# Patient Record
Sex: Female | Born: 1945
Health system: Southern US, Community
[De-identification: ages and names within clinical notes are randomized; demographics above are authoritative.]

## PROBLEM LIST (undated history)

## (undated) DIAGNOSIS — M7660 Achilles tendinitis, unspecified leg: Secondary | ICD-10-CM

## (undated) DIAGNOSIS — M8580 Other specified disorders of bone density and structure, unspecified site: Secondary | ICD-10-CM

## (undated) DIAGNOSIS — N946 Dysmenorrhea, unspecified: Secondary | ICD-10-CM

## (undated) DIAGNOSIS — N941 Unspecified dyspareunia: Secondary | ICD-10-CM

## (undated) DIAGNOSIS — K5790 Diverticulosis of intestine, part unspecified, without perforation or abscess without bleeding: Secondary | ICD-10-CM

## (undated) DIAGNOSIS — K219 Gastro-esophageal reflux disease without esophagitis: Secondary | ICD-10-CM

## (undated) DIAGNOSIS — N8110 Cystocele, unspecified: Secondary | ICD-10-CM

## (undated) DIAGNOSIS — E7800 Pure hypercholesterolemia, unspecified: Secondary | ICD-10-CM

## (undated) DIAGNOSIS — C439 Malignant melanoma of skin, unspecified: Secondary | ICD-10-CM

## (undated) DIAGNOSIS — Z8742 Personal history of other diseases of the female genital tract: Secondary | ICD-10-CM

## (undated) DIAGNOSIS — N816 Rectocele: Secondary | ICD-10-CM

## (undated) DIAGNOSIS — J309 Allergic rhinitis, unspecified: Secondary | ICD-10-CM

## (undated) DIAGNOSIS — M1990 Unspecified osteoarthritis, unspecified site: Secondary | ICD-10-CM

## (undated) DIAGNOSIS — E785 Hyperlipidemia, unspecified: Secondary | ICD-10-CM

## (undated) DIAGNOSIS — E559 Vitamin D deficiency, unspecified: Secondary | ICD-10-CM

## (undated) DIAGNOSIS — N814 Uterovaginal prolapse, unspecified: Secondary | ICD-10-CM

## (undated) DIAGNOSIS — D0359 Melanoma in situ of other part of trunk: Secondary | ICD-10-CM

## (undated) DIAGNOSIS — R7302 Impaired glucose tolerance (oral): Secondary | ICD-10-CM

## (undated) DIAGNOSIS — L509 Urticaria, unspecified: Secondary | ICD-10-CM

## (undated) HISTORY — PX: SKIN CANCER EXCISION: SHX779

## (undated) HISTORY — DX: Unspecified dyspareunia: N94.10

## (undated) HISTORY — DX: Dysmenorrhea, unspecified: N94.6

## (undated) HISTORY — DX: Unspecified osteoarthritis, unspecified site: M19.90

## (undated) HISTORY — DX: Cystocele, unspecified: N81.10

## (undated) HISTORY — DX: Rectocele: N81.6

## (undated) HISTORY — PX: APPENDECTOMY: SHX54

## (undated) HISTORY — DX: Diverticulosis of intestine, part unspecified, without perforation or abscess without bleeding: K57.90

## (undated) HISTORY — PX: COLONOSCOPY: SHX174

## (undated) HISTORY — DX: Personal history of other diseases of the female genital tract: Z87.42

## (undated) HISTORY — PX: OTHER SURGICAL HISTORY: SHX169

## (undated) HISTORY — DX: Malignant melanoma of skin, unspecified: C43.9

## (undated) HISTORY — DX: Achilles tendinitis, unspecified leg: M76.60

## (undated) HISTORY — PX: SEPTOPLASTY: SUR1290

## (undated) HISTORY — DX: Pure hypercholesterolemia, unspecified: E78.00

---

## 1989-10-05 HISTORY — PX: BREAST EXCISIONAL BIOPSY: SUR124

## 1989-10-05 HISTORY — PX: BREAST BIOPSY: SHX20

## 2005-10-15 ENCOUNTER — Ambulatory Visit: Payer: Self-pay | Admitting: Internal Medicine

## 2006-11-08 ENCOUNTER — Ambulatory Visit: Payer: Self-pay | Admitting: Internal Medicine

## 2007-06-01 ENCOUNTER — Ambulatory Visit: Payer: Self-pay | Admitting: Otolaryngology

## 2007-12-26 ENCOUNTER — Ambulatory Visit: Payer: Self-pay | Admitting: Internal Medicine

## 2009-07-04 ENCOUNTER — Ambulatory Visit: Payer: Self-pay | Admitting: Unknown Physician Specialty

## 2009-07-10 ENCOUNTER — Ambulatory Visit: Payer: Self-pay | Admitting: Gastroenterology

## 2010-06-17 ENCOUNTER — Emergency Department: Payer: Self-pay | Admitting: Emergency Medicine

## 2010-07-02 ENCOUNTER — Ambulatory Visit: Payer: Self-pay | Admitting: Family Medicine

## 2010-07-17 ENCOUNTER — Ambulatory Visit: Payer: Self-pay | Admitting: Unknown Physician Specialty

## 2011-10-13 ENCOUNTER — Ambulatory Visit: Payer: Self-pay | Admitting: Internal Medicine

## 2011-10-14 ENCOUNTER — Ambulatory Visit: Payer: Self-pay | Admitting: Internal Medicine

## 2012-04-13 ENCOUNTER — Ambulatory Visit: Payer: Self-pay | Admitting: Internal Medicine

## 2012-10-13 ENCOUNTER — Ambulatory Visit: Payer: Self-pay | Admitting: Internal Medicine

## 2012-10-24 ENCOUNTER — Ambulatory Visit: Payer: Self-pay | Admitting: Internal Medicine

## 2013-10-16 ENCOUNTER — Ambulatory Visit: Payer: Self-pay | Admitting: Internal Medicine

## 2014-08-14 ENCOUNTER — Ambulatory Visit: Payer: Self-pay | Admitting: Gastroenterology

## 2014-08-14 DIAGNOSIS — Z86010 Personal history of colonic polyps: Secondary | ICD-10-CM | POA: Insufficient documentation

## 2014-08-14 DIAGNOSIS — Z860101 Personal history of adenomatous and serrated colon polyps: Secondary | ICD-10-CM | POA: Insufficient documentation

## 2014-09-13 DIAGNOSIS — E559 Vitamin D deficiency, unspecified: Secondary | ICD-10-CM | POA: Insufficient documentation

## 2014-09-13 DIAGNOSIS — E785 Hyperlipidemia, unspecified: Secondary | ICD-10-CM | POA: Insufficient documentation

## 2014-09-13 DIAGNOSIS — N952 Postmenopausal atrophic vaginitis: Secondary | ICD-10-CM | POA: Insufficient documentation

## 2014-09-13 DIAGNOSIS — J309 Allergic rhinitis, unspecified: Secondary | ICD-10-CM | POA: Insufficient documentation

## 2014-09-13 DIAGNOSIS — R7302 Impaired glucose tolerance (oral): Secondary | ICD-10-CM | POA: Insufficient documentation

## 2014-09-13 DIAGNOSIS — K219 Gastro-esophageal reflux disease without esophagitis: Secondary | ICD-10-CM | POA: Insufficient documentation

## 2014-09-20 DIAGNOSIS — Z87898 Personal history of other specified conditions: Secondary | ICD-10-CM | POA: Insufficient documentation

## 2014-10-31 ENCOUNTER — Ambulatory Visit: Payer: Self-pay | Admitting: Internal Medicine

## 2014-10-31 DIAGNOSIS — Z1231 Encounter for screening mammogram for malignant neoplasm of breast: Secondary | ICD-10-CM | POA: Diagnosis not present

## 2015-01-28 LAB — SURGICAL PATHOLOGY

## 2015-02-05 DIAGNOSIS — H40003 Preglaucoma, unspecified, bilateral: Secondary | ICD-10-CM | POA: Diagnosis not present

## 2015-02-21 DIAGNOSIS — N950 Postmenopausal bleeding: Secondary | ICD-10-CM | POA: Diagnosis not present

## 2015-02-21 DIAGNOSIS — N816 Rectocele: Secondary | ICD-10-CM | POA: Diagnosis not present

## 2015-02-21 DIAGNOSIS — N3289 Other specified disorders of bladder: Secondary | ICD-10-CM | POA: Diagnosis not present

## 2015-02-21 DIAGNOSIS — N949 Unspecified condition associated with female genital organs and menstrual cycle: Secondary | ICD-10-CM | POA: Diagnosis not present

## 2015-02-21 DIAGNOSIS — N812 Incomplete uterovaginal prolapse: Secondary | ICD-10-CM | POA: Diagnosis not present

## 2015-02-21 DIAGNOSIS — N762 Acute vulvitis: Secondary | ICD-10-CM | POA: Diagnosis not present

## 2015-02-21 DIAGNOSIS — N8110 Cystocele, unspecified: Secondary | ICD-10-CM | POA: Diagnosis not present

## 2015-03-08 ENCOUNTER — Other Ambulatory Visit: Payer: Self-pay

## 2015-03-08 ENCOUNTER — Other Ambulatory Visit: Payer: Commercial Managed Care - HMO

## 2015-03-08 DIAGNOSIS — N950 Postmenopausal bleeding: Secondary | ICD-10-CM

## 2015-03-12 ENCOUNTER — Telehealth: Payer: Self-pay | Admitting: Obstetrics and Gynecology

## 2015-03-12 NOTE — Telephone Encounter (Signed)
CALLED FOR [place] RESULTS. PLEASE CALL PT.

## 2015-03-12 NOTE — Telephone Encounter (Signed)
Pls review u/s report and I will contact pt. ty

## 2015-03-14 ENCOUNTER — Telehealth: Payer: Self-pay | Admitting: Obstetrics and Gynecology

## 2015-03-14 NOTE — Telephone Encounter (Signed)
Pt aware u/s results are normal. Lining is thin- no need for bx. Pt will monitor bleeding. May need endosee per mad if persists. Pt advised to continue with premarin.

## 2015-03-14 NOTE — Telephone Encounter (Signed)
Patient called stating that she is very anxious to get the results from her ultrasound last Friday. Patient is aware that Dr D has the Ultrasound to review.Thanks

## 2015-08-20 DIAGNOSIS — Z23 Encounter for immunization: Secondary | ICD-10-CM | POA: Diagnosis not present

## 2015-08-21 DIAGNOSIS — H40003 Preglaucoma, unspecified, bilateral: Secondary | ICD-10-CM | POA: Diagnosis not present

## 2015-08-23 DIAGNOSIS — Z0100 Encounter for examination of eyes and vision without abnormal findings: Secondary | ICD-10-CM | POA: Diagnosis not present

## 2015-10-24 ENCOUNTER — Other Ambulatory Visit: Payer: Self-pay | Admitting: Internal Medicine

## 2015-10-24 DIAGNOSIS — Z0000 Encounter for general adult medical examination without abnormal findings: Secondary | ICD-10-CM | POA: Diagnosis not present

## 2015-10-24 DIAGNOSIS — R7302 Impaired glucose tolerance (oral): Secondary | ICD-10-CM | POA: Diagnosis not present

## 2015-10-24 DIAGNOSIS — Z23 Encounter for immunization: Secondary | ICD-10-CM | POA: Diagnosis not present

## 2015-10-24 DIAGNOSIS — K219 Gastro-esophageal reflux disease without esophagitis: Secondary | ICD-10-CM | POA: Diagnosis not present

## 2015-10-24 DIAGNOSIS — E559 Vitamin D deficiency, unspecified: Secondary | ICD-10-CM | POA: Diagnosis not present

## 2015-10-24 DIAGNOSIS — N952 Postmenopausal atrophic vaginitis: Secondary | ICD-10-CM | POA: Diagnosis not present

## 2015-10-24 DIAGNOSIS — Z1239 Encounter for other screening for malignant neoplasm of breast: Secondary | ICD-10-CM | POA: Diagnosis not present

## 2015-10-24 DIAGNOSIS — J301 Allergic rhinitis due to pollen: Secondary | ICD-10-CM | POA: Diagnosis not present

## 2015-10-24 DIAGNOSIS — E782 Mixed hyperlipidemia: Secondary | ICD-10-CM | POA: Diagnosis not present

## 2015-10-24 DIAGNOSIS — Z1231 Encounter for screening mammogram for malignant neoplasm of breast: Secondary | ICD-10-CM

## 2015-11-05 ENCOUNTER — Ambulatory Visit
Admission: RE | Admit: 2015-11-05 | Discharge: 2015-11-05 | Disposition: A | Discharge status: Home / Self Care | Payer: Commercial Managed Care - HMO | Source: Ambulatory Visit | Attending: Internal Medicine | Admitting: Internal Medicine

## 2015-11-05 DIAGNOSIS — L821 Other seborrheic keratosis: Secondary | ICD-10-CM | POA: Diagnosis not present

## 2015-11-05 DIAGNOSIS — L570 Actinic keratosis: Secondary | ICD-10-CM | POA: Diagnosis not present

## 2015-11-05 DIAGNOSIS — Z1231 Encounter for screening mammogram for malignant neoplasm of breast: Secondary | ICD-10-CM | POA: Diagnosis not present

## 2015-11-05 DIAGNOSIS — D485 Neoplasm of uncertain behavior of skin: Secondary | ICD-10-CM | POA: Diagnosis not present

## 2015-11-05 DIAGNOSIS — Z1283 Encounter for screening for malignant neoplasm of skin: Secondary | ICD-10-CM | POA: Diagnosis not present

## 2015-11-05 DIAGNOSIS — D0359 Melanoma in situ of other part of trunk: Secondary | ICD-10-CM | POA: Diagnosis not present

## 2015-11-14 DIAGNOSIS — D0359 Melanoma in situ of other part of trunk: Secondary | ICD-10-CM | POA: Diagnosis not present

## 2015-11-14 DIAGNOSIS — D225 Melanocytic nevi of trunk: Secondary | ICD-10-CM | POA: Diagnosis not present

## 2015-11-29 DIAGNOSIS — K55062 Diffuse acute infarction of intestine, part unspecified: Secondary | ICD-10-CM | POA: Diagnosis not present

## 2015-12-23 ENCOUNTER — Other Ambulatory Visit
Admission: RE | Admit: 2015-12-23 | Discharge: 2015-12-23 | Disposition: A | Discharge status: Home / Self Care | Payer: Commercial Managed Care - HMO | Source: Ambulatory Visit | Attending: Unknown Physician Specialty | Admitting: Unknown Physician Specialty

## 2015-12-23 DIAGNOSIS — M25562 Pain in left knee: Secondary | ICD-10-CM | POA: Diagnosis not present

## 2015-12-23 LAB — SYNOVIAL FLUID, CRYSTAL: Crystals, Fluid: NONE SEEN

## 2016-01-01 DIAGNOSIS — R262 Difficulty in walking, not elsewhere classified: Secondary | ICD-10-CM | POA: Diagnosis not present

## 2016-01-01 DIAGNOSIS — M25562 Pain in left knee: Secondary | ICD-10-CM | POA: Diagnosis not present

## 2016-01-03 DIAGNOSIS — R262 Difficulty in walking, not elsewhere classified: Secondary | ICD-10-CM | POA: Diagnosis not present

## 2016-01-03 DIAGNOSIS — M25562 Pain in left knee: Secondary | ICD-10-CM | POA: Diagnosis not present

## 2016-01-07 DIAGNOSIS — R262 Difficulty in walking, not elsewhere classified: Secondary | ICD-10-CM | POA: Diagnosis not present

## 2016-01-07 DIAGNOSIS — M25562 Pain in left knee: Secondary | ICD-10-CM | POA: Diagnosis not present

## 2016-01-09 DIAGNOSIS — M25562 Pain in left knee: Secondary | ICD-10-CM | POA: Diagnosis not present

## 2016-01-09 DIAGNOSIS — R262 Difficulty in walking, not elsewhere classified: Secondary | ICD-10-CM | POA: Diagnosis not present

## 2016-02-04 DIAGNOSIS — M25562 Pain in left knee: Secondary | ICD-10-CM | POA: Diagnosis not present

## 2016-02-04 DIAGNOSIS — R262 Difficulty in walking, not elsewhere classified: Secondary | ICD-10-CM | POA: Diagnosis not present

## 2016-02-06 DIAGNOSIS — R262 Difficulty in walking, not elsewhere classified: Secondary | ICD-10-CM | POA: Diagnosis not present

## 2016-02-06 DIAGNOSIS — M25562 Pain in left knee: Secondary | ICD-10-CM | POA: Diagnosis not present

## 2016-02-11 DIAGNOSIS — Z1283 Encounter for screening for malignant neoplasm of skin: Secondary | ICD-10-CM | POA: Diagnosis not present

## 2016-02-11 DIAGNOSIS — Z08 Encounter for follow-up examination after completed treatment for malignant neoplasm: Secondary | ICD-10-CM | POA: Diagnosis not present

## 2016-02-11 DIAGNOSIS — L910 Hypertrophic scar: Secondary | ICD-10-CM | POA: Diagnosis not present

## 2016-02-11 DIAGNOSIS — R262 Difficulty in walking, not elsewhere classified: Secondary | ICD-10-CM | POA: Diagnosis not present

## 2016-02-11 DIAGNOSIS — Z85820 Personal history of malignant melanoma of skin: Secondary | ICD-10-CM | POA: Diagnosis not present

## 2016-02-11 DIAGNOSIS — M25562 Pain in left knee: Secondary | ICD-10-CM | POA: Diagnosis not present

## 2016-02-13 DIAGNOSIS — M25562 Pain in left knee: Secondary | ICD-10-CM | POA: Diagnosis not present

## 2016-02-13 DIAGNOSIS — R262 Difficulty in walking, not elsewhere classified: Secondary | ICD-10-CM | POA: Diagnosis not present

## 2016-02-18 DIAGNOSIS — R262 Difficulty in walking, not elsewhere classified: Secondary | ICD-10-CM | POA: Diagnosis not present

## 2016-02-18 DIAGNOSIS — M25562 Pain in left knee: Secondary | ICD-10-CM | POA: Diagnosis not present

## 2016-02-20 DIAGNOSIS — R262 Difficulty in walking, not elsewhere classified: Secondary | ICD-10-CM | POA: Diagnosis not present

## 2016-02-20 DIAGNOSIS — M25562 Pain in left knee: Secondary | ICD-10-CM | POA: Diagnosis not present

## 2016-02-25 ENCOUNTER — Encounter: Payer: Commercial Managed Care - HMO | Admitting: Obstetrics and Gynecology

## 2016-02-27 DIAGNOSIS — R262 Difficulty in walking, not elsewhere classified: Secondary | ICD-10-CM | POA: Diagnosis not present

## 2016-02-27 DIAGNOSIS — M25562 Pain in left knee: Secondary | ICD-10-CM | POA: Diagnosis not present

## 2016-03-09 DIAGNOSIS — M25562 Pain in left knee: Secondary | ICD-10-CM | POA: Diagnosis not present

## 2016-03-09 DIAGNOSIS — R262 Difficulty in walking, not elsewhere classified: Secondary | ICD-10-CM | POA: Diagnosis not present

## 2016-03-12 ENCOUNTER — Encounter: Payer: Commercial Managed Care - HMO | Admitting: Obstetrics and Gynecology

## 2016-03-19 DIAGNOSIS — M25562 Pain in left knee: Secondary | ICD-10-CM | POA: Diagnosis not present

## 2016-03-19 DIAGNOSIS — R262 Difficulty in walking, not elsewhere classified: Secondary | ICD-10-CM | POA: Diagnosis not present

## 2016-04-01 DIAGNOSIS — R262 Difficulty in walking, not elsewhere classified: Secondary | ICD-10-CM | POA: Diagnosis not present

## 2016-04-01 DIAGNOSIS — M25562 Pain in left knee: Secondary | ICD-10-CM | POA: Diagnosis not present

## 2016-06-25 ENCOUNTER — Encounter: Payer: Commercial Managed Care - HMO | Admitting: Obstetrics and Gynecology

## 2016-07-09 DIAGNOSIS — H40003 Preglaucoma, unspecified, bilateral: Secondary | ICD-10-CM | POA: Diagnosis not present

## 2016-07-15 ENCOUNTER — Encounter: Payer: Commercial Managed Care - HMO | Admitting: Obstetrics and Gynecology

## 2016-07-20 NOTE — Progress Notes (Signed)
ANNUAL PREVENTATIVE CARE GYN  ENCOUNTER NOTE  Subjective:       Tracy Rodgers is a 70 y.o. [phone] female here for a routine annual gynecologic exam.  Current complaints: 1.   None- only wants pelvic- pcp does breast exam 2. Cystocele 3. Vaginal atrophy  Currently using remnant cream intravaginal twice weekly;  Seems to be helping symptoms Voiding regularly; occasionally with incomplete emptying; may consider pessary in future     Gynecologic History No LMP recorded. Patient is postmenopausal. Contraception: post menopausal status Last Pap: 2010 neg. Results were: normal Last mammogram: 10/2015 birad 1. Results were: normal  Obstetric History OB History  Gravida Para Term Preterm AB Living  3 3 3     3   SAB TAB Ectopic Multiple Live Births          3    # Outcome Date GA Lbr Len/2nd Weight Sex Delivery Anes PTL Lv  3 Term 1964   7 lb (3.175 kg)  Vag-Spont   LIV  2 Term 1963   4 lb (1.814 kg)  Vag-Spont   LIV  1 Term 1962   6 lb (2.722 kg)  Vag-Spont   LIV      Past Medical History:  Diagnosis Date  . Dyspareunia, female   . Elevated cholesterol   . History of heavy periods   . Painful menstrual periods     Past Surgical History:  Procedure Laterality Date  . APPENDECTOMY    . BREAST BIOPSY Left 1991   neg  . broken nose      Current Outpatient Prescriptions on File Prior to Visit  Medication Sig Dispense Refill  . conjugated estrogens (PREMARIN) vaginal cream Place vaginally.     No current facility-administered medications on file prior to visit.     Allergies  Allergen Reactions  . Nsaids Rash  . Oxycodone-Acetaminophen Nausea Only and Nausea And Vomiting  . Aspirin   . Ibuprofen Hives    Social History   Social History  . Marital status: Married    Spouse name: N/A  . Number of children: N/A  . Years of education: N/A   Occupational History  . Not on file.   Social History Main Topics  . Smoking status: Never Smoker  . Smokeless  tobacco: Never Used  . Alcohol use Yes     Comment: occas  . Drug use: No  . Sexual activity: Yes    Birth control/ protection: Post-menopausal   Other Topics Concern  . Not on file   Social History Narrative  . No narrative on file    Family History  Problem Relation Age of Onset  . Colon cancer Father   . Colon cancer Sister   . Breast cancer Neg Hx   . Diabetes Neg Hx   . Ovarian cancer Neg Hx   . Heart disease Neg Hx     The following portions of the patient's history were reviewed and updated as appropriate: allergies, current medications, past family history, past medical history, past social history, past surgical history and problem list.  Review of Systems ROS Review of Systems - General ROS: negative for - chills, fatigue, fever, hot flashes, night sweats, weight gain or weight loss Psychological ROS: negative for - anxiety, decreased libido, depression, mood swings, physical abuse or sexual abuse Ophthalmic ROS: negative for - blurry vision, eye pain or loss of vision ENT ROS: negative for - headaches, hearing change, visual changes or vocal changes Allergy and Immunology ROS:  negative for - hives, itchy/watery eyes or seasonal allergies Hematological and Lymphatic ROS: negative for - bleeding problems, bruising, swollen lymph nodes or weight loss Endocrine ROS: negative for - galactorrhea, hair pattern changes, hot flashes, malaise/lethargy, mood swings, palpitations, polydipsia/polyuria, skin changes, temperature intolerance or unexpected weight changes Breast ROS: negative for - new or changing breast lumps or nipple discharge Respiratory ROS: negative for - cough or shortness of breath Cardiovascular ROS: negative for - chest pain, irregular heartbeat, palpitations or shortness of breath Gastrointestinal ROS: no abdominal pain, change in bowel habits, or black or bloody stools Genito-Urinary ROS: no dysuria, trouble voiding, or hematuria Musculoskeletal ROS:  negative for - joint pain or joint stiffness Neurological ROS: negative for - bowel and bladder control changes Dermatological ROS: negative for rash and skin lesion changes   Objective:   BP 111/70   Pulse 73   Ht 5\' 5"  (1.651 m)   Wt 172 lb (78 kg)   BMI 28.62 kg/m  CONSTITUTIONAL: Well-developed, well-nourished female in no acute distress.  PSYCHIATRIC: Normal mood and affect. Normal behavior. Normal judgment and thought content. [place]: Alert and oriented to person, place, and time. Normal muscle tone coordination. No cranial nerve deficit noted. HENT:  Normocephalic, atraumatic EYES: Conjunctivae and EOM are normal.  No scleral icterus.  NECK: not examined SKIN: Skin is warm and dry. No rash noted. Not diaphoretic. No erythema. No pallor. CARDIOVASCULAR: not examined RESPIRATORY:not examined BREASTS: not examined ABDOMEN: Soft, no distention noted.  No tenderness, rebound or guarding.  BLADDER: Normal PELVIC:  External Genitalia: left labium minora melanosis; no hyperplastic, ulceration, or blue/black borders  BUS: Normal  Vagina: Normal  Cervix: Normal; no lesions; no cervical motion tenderness  Uterus: Normal; midplane, normal size and shape  Adnexa: Normal  RV: External Exam NormaI  MUSCULOSKELETAL: Normal range of motion. No tenderness.  No cyanosis, clubbing, or edema.  2+ distal pulses. LYMPHATIC: No Axillary, Supraclavicular, or Inguinal Adenopathy.    Assessment:   Annual gynecologic examination 70 y.o. Contraception: post menopausal status [id number] Cystocele, second-degree, minimally symptomatic Rectocele, asymptomatic Vaginal atrophy, improved with Premarin cream Left labium minora melanosis; history of melanoma (back) Plan:  Pap: Not needed Mammogram: utd-thru- pcp Stool Guaiac Testing:  Ordered Labs: thru pcp Routine preventative health maintenance measures emphasized: Exercise/Diet/Weight control, Tobacco Warnings and Alcohol/Substance use  risks Continue Premarin cream intravaginal twice  Consider switching to Intrarosa for vaginal dryness Return to Clinic - 1 Year Return sooner if pessary trial is desired Follow-up with biopsy of left labia minora melanosis  A total of 15 minutes were spent face-to-face with the patient during this encounter and over half of that time dealt with counseling and coordination of care.  Joyice Faster, CMA  Brayton Mars, MD  Note: This dictation was prepared with Dragon dictation along with smaller phrase technology. Any transcriptional errors that result from this process are unintentional.

## 2016-07-21 ENCOUNTER — Encounter: Payer: Self-pay | Admitting: Obstetrics and Gynecology

## 2016-07-21 ENCOUNTER — Ambulatory Visit (INDEPENDENT_AMBULATORY_CARE_PROVIDER_SITE_OTHER): Payer: Commercial Managed Care - HMO | Admitting: Obstetrics and Gynecology

## 2016-07-21 VITALS — BP 111/70 | HR 73 | Ht 65.0 in | Wt 172.0 lb

## 2016-07-21 DIAGNOSIS — N816 Rectocele: Secondary | ICD-10-CM

## 2016-07-21 DIAGNOSIS — N8111 Cystocele, midline: Secondary | ICD-10-CM | POA: Insufficient documentation

## 2016-07-21 DIAGNOSIS — N8110 Cystocele, unspecified: Secondary | ICD-10-CM | POA: Diagnosis not present

## 2016-07-21 DIAGNOSIS — N952 Postmenopausal atrophic vaginitis: Secondary | ICD-10-CM

## 2016-07-21 DIAGNOSIS — L814 Other melanin hyperpigmentation: Secondary | ICD-10-CM

## 2016-07-21 DIAGNOSIS — Z85820 Personal history of malignant melanoma of skin: Secondary | ICD-10-CM

## 2016-07-21 DIAGNOSIS — Z1211 Encounter for screening for malignant neoplasm of colon: Secondary | ICD-10-CM | POA: Diagnosis not present

## 2016-07-21 DIAGNOSIS — Z9889 Other specified postprocedural states: Secondary | ICD-10-CM | POA: Diagnosis not present

## 2016-07-21 NOTE — Patient Instructions (Addendum)
1. Recommend follow-up with dermatology to evaluate: Melanosis left labia minora. Consider biopsy due to history of melanoma on her back. 2.  Return in 1 year for follow-up 3.  Return for pessary trial if desired; contact us to schedule an appointment 4. Consider Intrarosa for vaginal dryness.   Pelvic Organ Prolapse Pelvic organ prolapse is the stretching, bulging, or dropping of pelvic organs into an abnormal position. It happens when the muscles and tissues that surround and support pelvic structures are stretched or weak. Pelvic organ prolapse can involve:  Vagina (vaginal prolapse).  Uterus (uterine prolapse).  Bladder (cystocele).  Rectum (rectocele).  Intestines (enterocele). When organs other than the vagina are involved, they often bulge into the vagina or protrude from the vagina, depending on how severe the prolapse is. CAUSES Causes of this condition include:  Pregnancy, labor, and childbirth.  Long-lasting (chronic) cough.  Chronic constipation.  Obesity.  Past pelvic surgery.  Aging. During and after menopause, a decreased production of the hormone estrogen can weaken pelvic ligaments and muscles.  Consistently lifting more than 50 lb (23 kg).  Buildup of fluid in the abdomen due to certain diseases and other conditions. SYMPTOMS Symptoms of this condition include:  Loss of bladder control when you cough, sneeze, strain, and exercise (stress incontinence). This may be worse immediately following childbirth, and it may gradually improve over time.  Feeling pressure in your pelvis or vagina. This pressure may increase when you cough or when you are having a bowel movement.  A bulge that protrudes from the opening of your vagina or against your vaginal wall. If your uterus protrudes through the opening of your vagina and rubs against your clothing, you may also experience soreness, ulcers, infection, pain, and bleeding.  Increased effort to have a bowel  movement or urinate.  Pain in your low back.  Pain, discomfort, or disinterest in sexual intercourse.  Repeated bladder infections (urinary tract infections).  Difficulty inserting or inability to insert a tampon or applicator. In some people, this condition does not cause any symptoms. DIAGNOSIS Your health care provider may perform an internal and external vaginal and rectal exam. During the exam, you may be asked to cough and strain while you are lying down, sitting, and standing up. Your health care provider will determine if other tests are required, such as bladder function tests. TREATMENT In most cases, this condition needs to be treated only if it produces symptoms. No treatment is guaranteed to correct the prolapse or relieve the symptoms completely. Treatment may include:  Lifestyle changes, such as:  Avoiding drinking beverages that contain caffeine.  Increasing your intake of high-fiber foods. This can help to decrease constipation and straining during bowel movements.  Emptying your bladder at scheduled times (bladder training therapy). This can help to reduce or avoid urinary incontinence.  Losing weight if you are overweight or obese.  Estrogen. Estrogen may help mild prolapse by increasing the strength and tone of pelvic floor muscles.  Kegel exercises. These may help mild cases of prolapse by strengthening and tightening the muscles of the pelvic floor.  Pessary insertion. A pessary is a soft, flexible device that is placed into your vagina by your health care provider to help support the vaginal walls and keep pelvic organs in place.  Surgery. This is often the only form of treatment for severe prolapse. Different types of surgeries are available. HOME CARE INSTRUCTIONS  Wear a sanitary pad or absorbent product if you have urinary incontinence.  Avoid heavy  lifting and straining with exercise and work. Do not hold your breath when you perform mild to moderate  lifting and exercise activities. Limit your activities as directed by your health care provider.  Take medicines only as directed by your health care provider.  Perform Kegel exercises as directed by your health care provider.  If you have a pessary, take care of it as directed by your health care provider. SEEK MEDICAL CARE IF:  Your symptoms interfere with your daily activities or sex life.  You need medicine to help with the discomfort.  You notice bleeding from the vagina that is not related to your period.  You have a fever.  You have pain or bleeding when you urinate.  You have bleeding when you have a bowel movement.  You lose urine when you have sex.  You have chronic constipation.  You have a pessary that falls out.  You have vaginal discharge that has a bad smell.  You have low abdominal pain or cramping that is unusual for you.   This information is not intended to replace advice given to you by your health care provider. Make sure you discuss any questions you have with your health care provider.   Document Released: 04/18/2014 Document Reviewed: 04/18/2014 Elsevier Interactive Patient Education [employer].

## 2016-07-23 ENCOUNTER — Telehealth: Payer: Self-pay | Admitting: Obstetrics and Gynecology

## 2016-07-23 DIAGNOSIS — D485 Neoplasm of uncertain behavior of skin: Secondary | ICD-10-CM | POA: Diagnosis not present

## 2016-07-23 DIAGNOSIS — L501 Idiopathic urticaria: Secondary | ICD-10-CM | POA: Diagnosis not present

## 2016-07-23 DIAGNOSIS — L814 Other melanin hyperpigmentation: Secondary | ICD-10-CM | POA: Diagnosis not present

## 2016-07-23 NOTE — Telephone Encounter (Signed)
Patient returned your call.

## 2016-07-23 NOTE — Telephone Encounter (Signed)
lmtrc

## 2016-07-23 NOTE — Telephone Encounter (Signed)
Per mad pt advised she may have him or derm do bx. Pt states she will have derm (Dr. Phillip Heal) bx. Pt will call be back with results.

## 2016-07-23 NOTE — Telephone Encounter (Signed)
Patient called questioning whether she should go to her dermatologist to have a biopsy done or if she should go to an oncologist. Thanks

## 2016-07-23 NOTE — Telephone Encounter (Signed)
PT CALLED AND SHE WENT TO THE DERMATOLOGIST TODAY AND HE BX IT AND SHE WANTED TO LET YOU AND DR DE KNOW SINCE DR DE IS THE ONE THAT FOUND IT AND HER DERMATOLOGIST ALSO SAID Graham YOU TO DR DE BECAUSE IT DID LOOK SUSPICIOUS.

## 2016-07-28 ENCOUNTER — Telehealth: Payer: Self-pay | Admitting: Obstetrics and Gynecology

## 2016-07-28 NOTE — Telephone Encounter (Signed)
PT WAS WANTING TO LET YOU AND DR DE KNOW THAT THE MOLE THAT WAS REMOVED WAS [place], AND SHE HAS HER TOTAL BODY LOOK OVER IN A FEW WEEKS WITH DR Phillip Heal.

## 2016-08-13 ENCOUNTER — Encounter: Payer: Self-pay | Admitting: Obstetrics and Gynecology

## 2016-08-13 ENCOUNTER — Ambulatory Visit (INDEPENDENT_AMBULATORY_CARE_PROVIDER_SITE_OTHER): Payer: Commercial Managed Care - HMO | Admitting: Obstetrics and Gynecology

## 2016-08-13 VITALS — BP 110/72 | HR 72 | Wt 172.0 lb

## 2016-08-13 DIAGNOSIS — Z4689 Encounter for fitting and adjustment of other specified devices: Secondary | ICD-10-CM | POA: Diagnosis not present

## 2016-08-13 DIAGNOSIS — N816 Rectocele: Secondary | ICD-10-CM

## 2016-08-13 DIAGNOSIS — N952 Postmenopausal atrophic vaginitis: Secondary | ICD-10-CM

## 2016-08-13 DIAGNOSIS — N8110 Cystocele, unspecified: Secondary | ICD-10-CM | POA: Diagnosis not present

## 2016-08-13 NOTE — Progress Notes (Signed)
Chief complaint: 1. Cystocele, second degree 2. Rectocele, mild 3. Vaginal atrophy  Patient presents for pessary fitting. She has second degree cystocele with incomplete emptying. No significant stress incontinence. Patient also has vaginal atrophy and has started using Premarin cream for management of this condition.  Patient had vulvar melanosis lesion biopsied by dermatology several weeks ago; pathology was benign.  Past medical history, past surgical history, problem list, medications, and allergies are reviewed  OBJECTIVE: BP 110/72 (BP Location: Left Arm, Patient Position: Sitting, Cuff Size: Large)   Pulse 72   Wt 172 lb (78 kg)   BMI 28.62 kg/m  PELVIC:             External Genitalia: left labium minora melanosis; no hyperplastic, ulceration, or blue/black borders             BUS: Normal             Vagina: Normal mucosa; second-degree cystocele; first-degree rectocele             Cervix: Normal; no lesions; no cervical motion tenderness             Uterus: Normal; midplane, normal size and shape             Adnexa: Normal             RV: External Exam NormaI   PROCEDURE: Pessary fitting  #2 ring with support pessary-successful  ASSESSMENT: 1. Second degree cystocele 2. Mild rectocele 3. Successful pessary fitting  PLAN: 1. #2 ring with support pessary is ordered 2. Return in 1 week for pessary insertion weighted arrives 3. Continue using Premarin cream intravaginal for vaginal atrophy  A total of 15 minutes were spent face-to-face with the patient during this encounter and over half of that time dealt with counseling and coordination of care.

## 2016-08-13 NOTE — Patient Instructions (Signed)
1. #3 ring with support pessary is fitted today and ordered 2. Return in 1 week when pessary arrives for insertion

## 2016-08-18 DIAGNOSIS — Z85820 Personal history of malignant melanoma of skin: Secondary | ICD-10-CM | POA: Diagnosis not present

## 2016-08-18 DIAGNOSIS — L821 Other seborrheic keratosis: Secondary | ICD-10-CM | POA: Diagnosis not present

## 2016-08-18 DIAGNOSIS — Z1283 Encounter for screening for malignant neoplasm of skin: Secondary | ICD-10-CM | POA: Diagnosis not present

## 2016-08-18 DIAGNOSIS — Z08 Encounter for follow-up examination after completed treatment for malignant neoplasm: Secondary | ICD-10-CM | POA: Diagnosis not present

## 2016-09-10 DIAGNOSIS — H2513 Age-related nuclear cataract, bilateral: Secondary | ICD-10-CM | POA: Diagnosis not present

## 2016-09-14 DIAGNOSIS — Z0100 Encounter for examination of eyes and vision without abnormal findings: Secondary | ICD-10-CM | POA: Diagnosis not present

## 2016-09-15 ENCOUNTER — Encounter: Payer: Commercial Managed Care - HMO | Admitting: Obstetrics and Gynecology

## 2016-09-17 ENCOUNTER — Encounter: Payer: Self-pay | Admitting: Obstetrics and Gynecology

## 2016-09-17 ENCOUNTER — Ambulatory Visit (INDEPENDENT_AMBULATORY_CARE_PROVIDER_SITE_OTHER): Payer: Commercial Managed Care - HMO | Admitting: Obstetrics and Gynecology

## 2016-09-17 VITALS — BP 135/69 | HR 80 | Ht 65.0 in | Wt 174.9 lb

## 2016-09-17 DIAGNOSIS — N816 Rectocele: Secondary | ICD-10-CM | POA: Diagnosis not present

## 2016-09-17 DIAGNOSIS — N8110 Cystocele, unspecified: Secondary | ICD-10-CM | POA: Diagnosis not present

## 2016-09-17 NOTE — Progress Notes (Signed)
Chief complaint: 1. Cystocele, second degree 2. Rectocele, mild 3. Vaginal atrophy  Patient presents for pessary insertion-#2 ring with support  OBJECTIVE: BP 135/69   Pulse 80   Ht 5\' 5"  (1.651 m)   Wt 174 lb 14.4 oz (79.3 kg)   BMI 29.10 kg/m  PELVIC: External Genitalia: left labium minora melanosis; no hyperplastic, ulceration, or blue/black borders BUS: Normal Vagina: Normal mucosa; second-degree cystocele; first-degree rectocele Cervix: Normal; no lesions; no cervical motion tenderness Uterus: Normal; midplane, normal size and shape Adnexa: Normal RV: External Exam NormaI   Pessary-#2 ring with support inserted  ASSESSMENT: 1. Cystocele, second degree 2. Rectocele, mild 3. Vaginal atrophy  PLAN: 1. New pessary is inserted 2. Insert Trimosan gel intravaginal once a week 3. Insert Premarin cream intravaginal once a week 4. Return in 4 weeks for follow-up or sooner if vaginal discharge, vaginal bleeding, or pelvic pain develop  Brayton Mars, MD  Note: This dictation was prepared with Dragon dictation along with smaller phrase technology. Any transcriptional errors that result from this process are unintentional.

## 2016-09-17 NOTE — Patient Instructions (Signed)
1. Ring with support pessary is inserted today 2. Use Trimosan gel intravaginal once a week 3. Use Premarin cream intravaginal once a week 4. Return in 4 weeks for follow-up, or sooner if vaginal discharge, vaginal bleeding, pelvic pain develop

## 2016-09-22 ENCOUNTER — Encounter: Payer: Commercial Managed Care - HMO | Admitting: Obstetrics and Gynecology

## 2016-10-21 ENCOUNTER — Encounter: Payer: Commercial Managed Care - HMO | Admitting: Obstetrics and Gynecology

## 2016-10-27 ENCOUNTER — Other Ambulatory Visit: Payer: Self-pay | Admitting: Internal Medicine

## 2016-10-27 DIAGNOSIS — Z23 Encounter for immunization: Secondary | ICD-10-CM | POA: Diagnosis not present

## 2016-10-27 DIAGNOSIS — E559 Vitamin D deficiency, unspecified: Secondary | ICD-10-CM | POA: Diagnosis not present

## 2016-10-27 DIAGNOSIS — Z1231 Encounter for screening mammogram for malignant neoplasm of breast: Secondary | ICD-10-CM

## 2016-10-27 DIAGNOSIS — Z0000 Encounter for general adult medical examination without abnormal findings: Secondary | ICD-10-CM | POA: Diagnosis not present

## 2016-10-27 DIAGNOSIS — E782 Mixed hyperlipidemia: Secondary | ICD-10-CM | POA: Diagnosis not present

## 2016-10-27 DIAGNOSIS — N952 Postmenopausal atrophic vaginitis: Secondary | ICD-10-CM | POA: Diagnosis not present

## 2016-10-27 DIAGNOSIS — L509 Urticaria, unspecified: Secondary | ICD-10-CM | POA: Diagnosis not present

## 2016-10-27 DIAGNOSIS — R7302 Impaired glucose tolerance (oral): Secondary | ICD-10-CM | POA: Diagnosis not present

## 2016-10-28 ENCOUNTER — Encounter: Payer: Commercial Managed Care - HMO | Admitting: Obstetrics and Gynecology

## 2016-11-03 ENCOUNTER — Encounter: Payer: Self-pay | Admitting: Obstetrics and Gynecology

## 2016-11-03 ENCOUNTER — Ambulatory Visit (INDEPENDENT_AMBULATORY_CARE_PROVIDER_SITE_OTHER): Payer: Commercial Managed Care - HMO | Admitting: Obstetrics and Gynecology

## 2016-11-03 VITALS — BP 106/73 | HR 73 | Ht 65.0 in | Wt 175.4 lb

## 2016-11-03 DIAGNOSIS — Z4689 Encounter for fitting and adjustment of other specified devices: Secondary | ICD-10-CM | POA: Diagnosis not present

## 2016-11-03 DIAGNOSIS — N816 Rectocele: Secondary | ICD-10-CM | POA: Diagnosis not present

## 2016-11-03 DIAGNOSIS — N8110 Cystocele, unspecified: Secondary | ICD-10-CM

## 2016-11-03 NOTE — Progress Notes (Signed)
Chief complaint: 1. Pessary maintenance 2. Cystocele  Patient presents for pessary maintenance. She is using a ring pessary with support for symptomatic cystocele. She has been using MetroGel twice weekly as well as Premarin cream intravaginal twice weekly.  Patient denies vaginal bleeding, vaginal discharge, vaginal pain, vaginal odor  Past medical history, past surgical history, problem list, medications, and allergies are reviewed  OBJECTIVE: BP 106/73   Pulse 73   Ht 5\' 5"  (1.651 m)   Wt 175 lb 6.4 oz (79.6 kg)   BMI 29.19 kg/m   Pleasant well-appearing white female in no acute distress. Alert and oriented. Abdomen: Soft, nontender PELVIC: External Genitalia: left labium minora melanosis; no hyperplastic, ulceration, or blue/black borders BUS: Normal Vagina: Normalmucosa; second-degree cystocele; first-degree rectocele Cervix: Normal; no lesions; no cervical motion tenderness Uterus: Normal; midplane, normal size and shape Adnexa: Normal RV: External Exam NormaI  The #2 ring with support pessary is removed, cleaned, and reinserted  ASSESSMENT: 1. Cystocele, second degree 2. Rectocele, mild 3. Vaginal atrophy  PLAN: 1. #2 ring with support pessary is removed, cleaned, reinserted 2. Insert Trimosan gel intravaginal once a week 3. Insert Premarin cream intravaginal once a week 4. Return in 8 weeks for follow-up or sooner if vaginal discharge, vaginal bleeding, or pelvic pain develop  Brayton Mars, MD  A total of 15 minutes were spent face-to-face with the patient during this encounter and over half of that time dealt with counseling and coordination of care.  Note: This dictation was prepared with Dragon dictation along with smaller phrase technology. Any transcriptional errors that result from this process are unintentional.

## 2016-11-03 NOTE — Patient Instructions (Signed)
1. Return in 8 weeks for pessary maintenance 2. Continue using Trimosan gel weekly 3. Continue using Premarin cream weekly

## 2016-11-05 ENCOUNTER — Ambulatory Visit
Admission: RE | Admit: 2016-11-05 | Discharge: 2016-11-05 | Disposition: A | Discharge status: Home / Self Care | Payer: Medicare HMO | Source: Ambulatory Visit | Attending: Internal Medicine | Admitting: Internal Medicine

## 2016-11-05 DIAGNOSIS — Z1231 Encounter for screening mammogram for malignant neoplasm of breast: Secondary | ICD-10-CM | POA: Insufficient documentation

## 2016-11-23 DIAGNOSIS — J301 Allergic rhinitis due to pollen: Secondary | ICD-10-CM | POA: Diagnosis not present

## 2016-11-23 DIAGNOSIS — L509 Urticaria, unspecified: Secondary | ICD-10-CM | POA: Diagnosis not present

## 2016-11-23 DIAGNOSIS — T783XXA Angioneurotic edema, initial encounter: Secondary | ICD-10-CM | POA: Diagnosis not present

## 2017-01-07 ENCOUNTER — Encounter: Payer: Commercial Managed Care - HMO | Admitting: Obstetrics and Gynecology

## 2017-01-12 ENCOUNTER — Encounter: Payer: Self-pay | Admitting: Obstetrics and Gynecology

## 2017-01-12 ENCOUNTER — Ambulatory Visit (INDEPENDENT_AMBULATORY_CARE_PROVIDER_SITE_OTHER): Payer: Medicare HMO | Admitting: Obstetrics and Gynecology

## 2017-01-12 VITALS — BP 111/73 | HR 73 | Ht 65.0 in | Wt 176.4 lb

## 2017-01-12 DIAGNOSIS — Z4689 Encounter for fitting and adjustment of other specified devices: Secondary | ICD-10-CM | POA: Diagnosis not present

## 2017-01-12 DIAGNOSIS — N952 Postmenopausal atrophic vaginitis: Secondary | ICD-10-CM | POA: Diagnosis not present

## 2017-01-12 DIAGNOSIS — N8110 Cystocele, unspecified: Secondary | ICD-10-CM

## 2017-01-12 NOTE — Patient Instructions (Signed)
1. Continue using Premarin cream intravaginally once a week 2. Continue using Trimosan gel intravaginally once a week 3. Return in 3 months for pessary maintenance

## 2017-01-12 NOTE — Progress Notes (Signed)
Chief complaint: 1. Pessary maintenance 2. Cystocele 3. Vaginal atrophy  8 week pessary maintenance visit #2 ring with support pessary Using Trimosan gel intravaginal weekly Using Premarin cream intravaginal weekly Continues to feel that it is helpful in voiding more completely; no incomplete bladder emptying Patient voids every 2 hours during the day She is not experiencing any nocturia Patient reports no significant vaginal bleeding, vaginal discharge, vaginal odor, vaginal pain  OBJECTIVE: BP 111/73   Pulse 73   Ht 5\' 5"  (1.651 m)   Wt 176 lb 6.4 oz (80 kg)   BMI 29.35 kg/m    Pleasant well-appearing white female in no acute distress. Alert and oriented. Abdomen: Soft, nontender PELVIC: External Genitalia: left labium minora melanosis; no hyperplastic, ulceration, or blue/black borders BUS: Normal Vagina: Normalmucosa; second-degree cystocele; first-degree rectocele Cervix: Normal; no lesions; no cervical motion tenderness Uterus: Normal; midplane, normal size and shape Adnexa: Normal RV: External Exam NormaI  The #2 ring with support pessary is removed, cleaned, and reinserted  ASSESSMENT: 1. Cystocele, second degree 2. Rectocele, mild 3. Vaginal atrophy 4. Pessary maintenance, normal, with resolution of incomplete bladder emptying  PLAN: 1. #2 ring with support pessary is removed, cleaned, reinserted 2. Insert Trimosan gel intravaginal once a week 3. Insert Premarin cream intravaginal once a week 4. Return in 12 weeks for pessary maintenance  A total of 15 minutes were spent face-to-face with the patient during this encounter and over half of that time dealt with counseling and coordination of care.   Brayton Mars, MD  Note: This dictation was prepared with Dragon dictation along with smaller phrase technology. Any transcriptional errors that result from this  process are unintentional.

## 2017-01-13 ENCOUNTER — Encounter: Payer: Commercial Managed Care - HMO | Admitting: Obstetrics and Gynecology

## 2017-02-18 DIAGNOSIS — L570 Actinic keratosis: Secondary | ICD-10-CM | POA: Diagnosis not present

## 2017-02-18 DIAGNOSIS — L821 Other seborrheic keratosis: Secondary | ICD-10-CM | POA: Diagnosis not present

## 2017-02-18 DIAGNOSIS — Z872 Personal history of diseases of the skin and subcutaneous tissue: Secondary | ICD-10-CM | POA: Diagnosis not present

## 2017-02-18 DIAGNOSIS — B001 Herpesviral vesicular dermatitis: Secondary | ICD-10-CM | POA: Diagnosis not present

## 2017-02-18 DIAGNOSIS — Z85820 Personal history of malignant melanoma of skin: Secondary | ICD-10-CM | POA: Diagnosis not present

## 2017-04-15 DIAGNOSIS — M7661 Achilles tendinitis, right leg: Secondary | ICD-10-CM | POA: Diagnosis not present

## 2017-04-15 DIAGNOSIS — M79671 Pain in right foot: Secondary | ICD-10-CM | POA: Diagnosis not present

## 2017-04-15 DIAGNOSIS — M7989 Other specified soft tissue disorders: Secondary | ICD-10-CM | POA: Diagnosis not present

## 2017-04-16 DIAGNOSIS — M25571 Pain in right ankle and joints of right foot: Secondary | ICD-10-CM | POA: Diagnosis not present

## 2017-04-16 DIAGNOSIS — R262 Difficulty in walking, not elsewhere classified: Secondary | ICD-10-CM | POA: Diagnosis not present

## 2017-04-19 DIAGNOSIS — M25571 Pain in right ankle and joints of right foot: Secondary | ICD-10-CM | POA: Diagnosis not present

## 2017-04-19 DIAGNOSIS — R262 Difficulty in walking, not elsewhere classified: Secondary | ICD-10-CM | POA: Diagnosis not present

## 2017-04-20 ENCOUNTER — Encounter: Payer: Medicare HMO | Admitting: Obstetrics and Gynecology

## 2017-04-21 DIAGNOSIS — R262 Difficulty in walking, not elsewhere classified: Secondary | ICD-10-CM | POA: Diagnosis not present

## 2017-04-21 DIAGNOSIS — M25571 Pain in right ankle and joints of right foot: Secondary | ICD-10-CM | POA: Diagnosis not present

## 2017-04-23 DIAGNOSIS — M25571 Pain in right ankle and joints of right foot: Secondary | ICD-10-CM | POA: Diagnosis not present

## 2017-04-23 DIAGNOSIS — R262 Difficulty in walking, not elsewhere classified: Secondary | ICD-10-CM | POA: Diagnosis not present

## 2017-04-26 DIAGNOSIS — M25571 Pain in right ankle and joints of right foot: Secondary | ICD-10-CM | POA: Diagnosis not present

## 2017-04-26 DIAGNOSIS — R262 Difficulty in walking, not elsewhere classified: Secondary | ICD-10-CM | POA: Diagnosis not present

## 2017-04-27 ENCOUNTER — Ambulatory Visit (INDEPENDENT_AMBULATORY_CARE_PROVIDER_SITE_OTHER): Payer: Medicare HMO | Admitting: Obstetrics and Gynecology

## 2017-04-27 ENCOUNTER — Encounter: Payer: Self-pay | Admitting: Obstetrics and Gynecology

## 2017-04-27 VITALS — BP 124/76 | HR 66 | Ht 65.0 in | Wt 173.7 lb

## 2017-04-27 DIAGNOSIS — N816 Rectocele: Secondary | ICD-10-CM

## 2017-04-27 DIAGNOSIS — Z4689 Encounter for fitting and adjustment of other specified devices: Secondary | ICD-10-CM

## 2017-04-27 DIAGNOSIS — N952 Postmenopausal atrophic vaginitis: Secondary | ICD-10-CM

## 2017-04-27 DIAGNOSIS — N8110 Cystocele, unspecified: Secondary | ICD-10-CM

## 2017-04-27 NOTE — Patient Instructions (Signed)
1. Return in 4 months for Medicare breast and pelvic exam and pessary maintenance 2. Continue using Trimosan gel intravaginal weekly 3. Continue using Premarin cream intravaginal weekly

## 2017-04-27 NOTE — Progress Notes (Signed)
Chief complaint: 1. Pessary maintenance (last visit 01/13/2007 2. Cystocele 3. Vaginal atrophy  12 week pessary maintenance visit #2 ring with support pessary Using Trimosan gel intravaginal weekly Using Premarin cream intravaginal weekly Continues to feel that it is helpful in voiding more completely; no incomplete bladder emptying Patient voids every 2 hours during the day Nocturia 1 Patient reports no significant vaginal bleeding, vaginal discharge, vaginal odor, vaginal pain  OBJECTIVE: BP 111/73   Pulse 73   Ht 5\' 5"  (1.651 m)   Wt 176 lb 6.4 oz (80 kg)   BMI 29.35 kg/m    Pleasant well-appearing white female in no acute distress. Alert and oriented. Abdomen: Soft, nontender PELVIC: External Genitalia: left labium minora melanosis; no hyperplastic, ulceration, or blue/black borders BUS: Normal Vagina: Normalmucosa; second-degree cystocele; first-degree rectocele Cervix: Normal; no lesions; no cervical motion tenderness Uterus: Normal; midplane, normal size and shape Adnexa: Normal RV: External Exam NormaI external exam  The #2 ring with support pessary is removed, cleaned, and reinserted  ASSESSMENT: 1. Cystocele, second degree 2. Rectocele, mild 3. Vaginal atrophy 4. Pessary maintenance, normal, with resolution of incomplete bladder emptying  PLAN: 1. #2 ring with support pessary is removed, cleaned, reinserted 2. Insert Trimosan gel intravaginal once a week 3. Insert Premarin cream intravaginal once a week 4. Return in 16 weeks for pessary maintenance  A total of 15 minutes were spent face-to-face with the patient during this encounter and over half of that time dealt with counseling and coordination of care.   Brayton Mars, MD

## 2017-04-30 DIAGNOSIS — R262 Difficulty in walking, not elsewhere classified: Secondary | ICD-10-CM | POA: Diagnosis not present

## 2017-04-30 DIAGNOSIS — M25571 Pain in right ankle and joints of right foot: Secondary | ICD-10-CM | POA: Diagnosis not present

## 2017-05-03 DIAGNOSIS — M25571 Pain in right ankle and joints of right foot: Secondary | ICD-10-CM | POA: Diagnosis not present

## 2017-05-03 DIAGNOSIS — R262 Difficulty in walking, not elsewhere classified: Secondary | ICD-10-CM | POA: Diagnosis not present

## 2017-05-05 DIAGNOSIS — M25571 Pain in right ankle and joints of right foot: Secondary | ICD-10-CM | POA: Diagnosis not present

## 2017-05-05 DIAGNOSIS — R262 Difficulty in walking, not elsewhere classified: Secondary | ICD-10-CM | POA: Diagnosis not present

## 2017-05-07 DIAGNOSIS — M25571 Pain in right ankle and joints of right foot: Secondary | ICD-10-CM | POA: Diagnosis not present

## 2017-05-07 DIAGNOSIS — R262 Difficulty in walking, not elsewhere classified: Secondary | ICD-10-CM | POA: Diagnosis not present

## 2017-05-10 DIAGNOSIS — M25571 Pain in right ankle and joints of right foot: Secondary | ICD-10-CM | POA: Diagnosis not present

## 2017-05-10 DIAGNOSIS — R262 Difficulty in walking, not elsewhere classified: Secondary | ICD-10-CM | POA: Diagnosis not present

## 2017-05-12 DIAGNOSIS — M25571 Pain in right ankle and joints of right foot: Secondary | ICD-10-CM | POA: Diagnosis not present

## 2017-05-12 DIAGNOSIS — R262 Difficulty in walking, not elsewhere classified: Secondary | ICD-10-CM | POA: Diagnosis not present

## 2017-05-13 DIAGNOSIS — M25571 Pain in right ankle and joints of right foot: Secondary | ICD-10-CM | POA: Diagnosis not present

## 2017-05-13 DIAGNOSIS — R262 Difficulty in walking, not elsewhere classified: Secondary | ICD-10-CM | POA: Diagnosis not present

## 2017-05-17 DIAGNOSIS — M25571 Pain in right ankle and joints of right foot: Secondary | ICD-10-CM | POA: Diagnosis not present

## 2017-05-17 DIAGNOSIS — R262 Difficulty in walking, not elsewhere classified: Secondary | ICD-10-CM | POA: Diagnosis not present

## 2017-05-19 DIAGNOSIS — R262 Difficulty in walking, not elsewhere classified: Secondary | ICD-10-CM | POA: Diagnosis not present

## 2017-05-19 DIAGNOSIS — M25571 Pain in right ankle and joints of right foot: Secondary | ICD-10-CM | POA: Diagnosis not present

## 2017-05-20 DIAGNOSIS — M25571 Pain in right ankle and joints of right foot: Secondary | ICD-10-CM | POA: Diagnosis not present

## 2017-05-20 DIAGNOSIS — R262 Difficulty in walking, not elsewhere classified: Secondary | ICD-10-CM | POA: Diagnosis not present

## 2017-05-24 DIAGNOSIS — R262 Difficulty in walking, not elsewhere classified: Secondary | ICD-10-CM | POA: Diagnosis not present

## 2017-05-24 DIAGNOSIS — M25571 Pain in right ankle and joints of right foot: Secondary | ICD-10-CM | POA: Diagnosis not present

## 2017-06-02 DIAGNOSIS — R262 Difficulty in walking, not elsewhere classified: Secondary | ICD-10-CM | POA: Diagnosis not present

## 2017-06-02 DIAGNOSIS — M25571 Pain in right ankle and joints of right foot: Secondary | ICD-10-CM | POA: Diagnosis not present

## 2017-06-03 DIAGNOSIS — R262 Difficulty in walking, not elsewhere classified: Secondary | ICD-10-CM | POA: Diagnosis not present

## 2017-06-03 DIAGNOSIS — M25571 Pain in right ankle and joints of right foot: Secondary | ICD-10-CM | POA: Diagnosis not present

## 2017-06-10 DIAGNOSIS — M25571 Pain in right ankle and joints of right foot: Secondary | ICD-10-CM | POA: Diagnosis not present

## 2017-06-10 DIAGNOSIS — R262 Difficulty in walking, not elsewhere classified: Secondary | ICD-10-CM | POA: Diagnosis not present

## 2017-06-14 DIAGNOSIS — R262 Difficulty in walking, not elsewhere classified: Secondary | ICD-10-CM | POA: Diagnosis not present

## 2017-06-14 DIAGNOSIS — M25571 Pain in right ankle and joints of right foot: Secondary | ICD-10-CM | POA: Diagnosis not present

## 2017-06-16 DIAGNOSIS — M25571 Pain in right ankle and joints of right foot: Secondary | ICD-10-CM | POA: Diagnosis not present

## 2017-06-16 DIAGNOSIS — R262 Difficulty in walking, not elsewhere classified: Secondary | ICD-10-CM | POA: Diagnosis not present

## 2017-06-18 DIAGNOSIS — R262 Difficulty in walking, not elsewhere classified: Secondary | ICD-10-CM | POA: Diagnosis not present

## 2017-06-18 DIAGNOSIS — M25571 Pain in right ankle and joints of right foot: Secondary | ICD-10-CM | POA: Diagnosis not present

## 2017-06-21 DIAGNOSIS — R262 Difficulty in walking, not elsewhere classified: Secondary | ICD-10-CM | POA: Diagnosis not present

## 2017-06-21 DIAGNOSIS — Z0100 Encounter for examination of eyes and vision without abnormal findings: Secondary | ICD-10-CM | POA: Diagnosis not present

## 2017-06-21 DIAGNOSIS — M25571 Pain in right ankle and joints of right foot: Secondary | ICD-10-CM | POA: Diagnosis not present

## 2017-06-30 DIAGNOSIS — R262 Difficulty in walking, not elsewhere classified: Secondary | ICD-10-CM | POA: Diagnosis not present

## 2017-06-30 DIAGNOSIS — M25571 Pain in right ankle and joints of right foot: Secondary | ICD-10-CM | POA: Diagnosis not present

## 2017-07-02 DIAGNOSIS — M25571 Pain in right ankle and joints of right foot: Secondary | ICD-10-CM | POA: Diagnosis not present

## 2017-07-02 DIAGNOSIS — R262 Difficulty in walking, not elsewhere classified: Secondary | ICD-10-CM | POA: Diagnosis not present

## 2017-07-22 ENCOUNTER — Encounter: Payer: Commercial Managed Care - HMO | Admitting: Obstetrics and Gynecology

## 2017-08-03 DIAGNOSIS — Z23 Encounter for immunization: Secondary | ICD-10-CM | POA: Diagnosis not present

## 2017-09-07 ENCOUNTER — Encounter: Payer: Self-pay | Admitting: Obstetrics and Gynecology

## 2017-09-07 ENCOUNTER — Ambulatory Visit (INDEPENDENT_AMBULATORY_CARE_PROVIDER_SITE_OTHER): Payer: Medicare HMO | Admitting: Obstetrics and Gynecology

## 2017-09-07 VITALS — BP 106/68 | HR 80 | Ht 65.0 in | Wt 175.0 lb

## 2017-09-07 DIAGNOSIS — Z86010 Personal history of colonic polyps: Secondary | ICD-10-CM

## 2017-09-07 DIAGNOSIS — Z4689 Encounter for fitting and adjustment of other specified devices: Secondary | ICD-10-CM

## 2017-09-07 DIAGNOSIS — N952 Postmenopausal atrophic vaginitis: Secondary | ICD-10-CM

## 2017-09-07 DIAGNOSIS — L814 Other melanin hyperpigmentation: Secondary | ICD-10-CM

## 2017-09-07 DIAGNOSIS — N8110 Cystocele, unspecified: Secondary | ICD-10-CM | POA: Diagnosis not present

## 2017-09-07 DIAGNOSIS — Z1211 Encounter for screening for malignant neoplasm of colon: Secondary | ICD-10-CM | POA: Diagnosis not present

## 2017-09-07 DIAGNOSIS — N816 Rectocele: Secondary | ICD-10-CM | POA: Diagnosis not present

## 2017-09-07 NOTE — Progress Notes (Signed)
ANNUAL PREVENTATIVE CARE GYN  ENCOUNTER NOTE  Subjective:       Tracy Rodgers is a 71 y.o. [phone] female here for a routine annual gynecologic exam.  Current complaints:  1.  MEDICARE BREAST AND PELVIC 2. 4 MONTH PESSARY CHECK-no complaints  #2 ring with support pessary (last visit 04/27/2017) Trimosan gel intravaginally weekly Premarin cream intravaginally weekly Voiding appears to be more complete with no episodes of incomplete bladder emptying. Patient voids every 2 hours during the day. No nocturia. No significant vaginal bleeding, vaginal odor, or vaginal pain.   Gynecologic History No LMP recorded. Patient is postmenopausal. Contraception: post menopausal status Last ASN:[id number]. Results were: normal Last mammogram: 11/2016. birad 1  Results were: normal  Obstetric History OB History  Gravida Para Term Preterm AB Living  3 3 3     3   SAB TAB Ectopic Multiple Live Births          3    # Outcome Date GA Lbr Len/2nd Weight Sex Delivery Anes PTL Lv  3 Term 1964   7 lb (3.175 kg)  Vag-Spont   LIV  2 Term 1963   4 lb (1.814 kg)  Vag-Spont   LIV  1 Term 1962   6 lb (2.722 kg)  Vag-Spont   LIV      Past Medical History:  Diagnosis Date  . Achilles tendinitis   . Arthritis   . Cystocele, unspecified (CODE)   . Diverticulosis   . Dyspareunia, female   . Elevated cholesterol   . History of heavy periods   . Melanoma ([place])   . Painful menstrual periods   . Rectocele     Past Surgical History:  Procedure Laterality Date  . APPENDECTOMY    . BREAST BIOPSY Left 1991   neg  . broken nose    . SKIN CANCER EXCISION      Current Outpatient Medications on File Prior to Visit  Medication Sig Dispense Refill  . conjugated estrogens (PREMARIN) vaginal cream Place vaginally.    . Oxyquinolone Sulfate (TRIMO-SAN VA) Place vaginally.    Marland Kitchen scopolamine (TRANSDERM-SCOP, 1.5 MG,) 1 MG/3DAYS PLACE 1 PATCH (1.5 MG TOTAL) ONTO THE SKIN EVERY THIRD DAY.     No current  facility-administered medications on file prior to visit.     Allergies  Allergen Reactions  . Nsaids Rash  . Oxycodone-Acetaminophen Nausea Only and Nausea And Vomiting  . Aspirin   . Ibuprofen Hives    Social History   Socioeconomic History  . Marital status: Married    Spouse name: Not on file  . Number of children: Not on file  . Years of education: Not on file  . Highest education level: Not on file  Social Needs  . Financial resource strain: Not on file  . Food insecurity - worry: Not on file  . Food insecurity - inability: Not on file  . Transportation needs - medical: Not on file  . Transportation needs - non-medical: Not on file  Occupational History  . Not on file  Tobacco Use  . Smoking status: Never Smoker  . Smokeless tobacco: Never Used  Substance and Sexual Activity  . Alcohol use: Yes    Comment: occas  . Drug use: No  . Sexual activity: Yes    Birth control/protection: Post-menopausal  Other Topics Concern  . Not on file  Social History Narrative  . Not on file    Family History  Problem Relation Age of Onset  . Colon  cancer Father   . Colon cancer Sister   . Breast cancer Neg Hx   . Diabetes Neg Hx   . Ovarian cancer Neg Hx   . Heart disease Neg Hx     The following portions of the patient's history were reviewed and updated as appropriate: allergies, current medications, past family history, past medical history, past social history, past surgical history and problem list.  Review of Systems ROS Review of Systems - General ROS: negative for - chills, fatigue, fever, hot flashes, night sweats, weight gain or weight loss Psychological ROS: negative for - anxiety, decreased libido, depression, mood swings, physical abuse or sexual abuse Ophthalmic ROS: negative for - blurry vision, eye pain or loss of vision ENT ROS: negative for - headaches, hearing change, visual changes or vocal changes Allergy and Immunology ROS: negative for - hives,  itchy/watery eyes or seasonal allergies Hematological and Lymphatic ROS: negative for - bleeding problems, bruising, swollen lymph nodes or weight loss Endocrine ROS: negative for - galactorrhea, hair pattern changes, hot flashes, malaise/lethargy, mood swings, palpitations, polydipsia/polyuria, skin changes, temperature intolerance or unexpected weight changes Breast ROS: negative for - new or changing breast lumps or nipple discharge Respiratory ROS: negative for - cough or shortness of breath Cardiovascular ROS: negative for - chest pain, irregular heartbeat, palpitations or shortness of breath Gastrointestinal ROS: no abdominal pain, change in bowel habits, or black or bloody stools Genito-Urinary ROS: no dysuria, trouble voiding, or hematuria Musculoskeletal ROS: negative for - joint pain or joint stiffness Neurological ROS: negative for - bowel and bladder control changes Dermatological ROS: negative for rash and skin lesion changes   Objective:   BP 106/68   Pulse 80   Ht 5\' 5"  (1.651 m)   Wt 175 lb (79.4 kg)   BMI 29.12 kg/m  CONSTITUTIONAL: Well-developed, well-nourished female in no acute distress.  PSYCHIATRIC: Normal mood and affect. Normal behavior. Normal judgment and thought content. [place]: Alert and oriented to person, place, and time. Normal muscle tone coordination. No cranial nerve deficit noted. HENT:  Normocephalic, atraumatic, External right and left ear normal. Oropharynx is clear and moist EYES: Conjunctivae and EOM are normal. No scleral icterus.  NECK: Normal range of motion, supple, no masses.  Normal thyroid.  SKIN: Skin is warm and dry. No rash noted. Not diaphoretic. No erythema. No pallor. CARDIOVASCULAR: Normal heart rate noted, regular rhythm, no murmur. RESPIRATORY: Clear to auscultation bilaterally. Effort and breath sounds normal, no problems with respiration noted. BREASTS: Symmetric in size. No masses, skin changes, nipple drainage, or  lymphadenopathy. ABDOMEN: Soft, normal bowel sounds, no distention noted.  No tenderness, rebound or guarding.  BLADDER: Normal PELVIC:  External Genitalia: Normal  BUS: Normal  Vagina: Normal; good estrogen effect; Premarin cream and vaginal vault  Cervix: Normal; no lesions; no cervical motion tenderness  Uterus: Normal; midplane, normal size shape, mobile, nontender  Adnexa: Normal; nonpalpable and nontender  RV: External Exam NormaI, No Rectal Masses and Normal Sphincter tone  MUSCULOSKELETAL: Normal range of motion. No tenderness.  No cyanosis, clubbing, or edema.  2+ distal pulses. LYMPHATIC: No Axillary, Supraclavicular, or Inguinal Adenopathy.  PROCEDURE: Pessary is removed, cleaned, and reinserted (#2 ring with support diaphragm pessary)  Assessment:   Annual gynecologic examination 71 y.o. Contraception: post menopausal status bmi 29  #2 ring with support pessary-maintenance Problem List Items Addressed This Visit    Vaginal atrophy   Melanosis of vulva   Rectocele   Cystocele, unspecified (CODE)    Other Visit  Diagnoses    Pessary maintenance    -  Primary   Screening for colon cancer          Plan:  Pap: Not needed Mammogram: thru pcp- due in 11/2017 Stool Guaiac Testing:  Ordered Labs: thru pcp Routine preventative health maintenance measures emphasized: Exercise/Diet/Weight control, Tobacco Warnings and Alcohol/Substance use risks Continue with Premarin cream intravaginally once a week Continue with Trimosan gel intravaginally once a week Return in 4 months for pessary maintenance Return to [place], CMA  Brayton Mars, MD  Note: This dictation was prepared with Dragon dictation along with smaller phrase technology. Any transcriptional errors that result from this process are unintentional.

## 2017-09-07 NOTE — Patient Instructions (Signed)
1.  No Pap smear needed 2.  Mammogram through primary care is due in 2019, February 3.  Stool guaiac cards testing is ordered for colon cancer screening 4.  Screening labs are to be obtained through primary care 5.  Continue with Premarin cream intravaginal weekly 6.  Continue Trimosan gel intravaginally weekly 7.  Return in 1 year for annual exam 8.  Return for pessary maintenance in 6 months   Health Maintenance for Postmenopausal Women Menopause is a normal process in which your reproductive ability comes to an end. This process happens gradually over a span of months to years, usually between the ages of 53 and 57. Menopause is complete when you have missed 12 consecutive menstrual periods. It is important to talk with your health care provider about some of the most common conditions that affect postmenopausal women, such as heart disease, cancer, and bone loss (osteoporosis). Adopting a healthy lifestyle and getting preventive care can help to promote your health and wellness. Those actions can also lower your chances of developing some of these common conditions. What should I know about menopause? During menopause, you may experience a number of symptoms, such as:  Moderate-to-severe hot flashes.  Night sweats.  Decrease in sex drive.  Mood swings.  Headaches.  Tiredness.  Irritability.  Memory problems.  Insomnia.  Choosing to treat or not to treat menopausal changes is an individual decision that you make with your health care provider. What should I know about hormone replacement therapy and supplements? Hormone therapy products are effective for treating symptoms that are associated with menopause, such as hot flashes and night sweats. Hormone replacement carries certain risks, especially as you become older. If you are thinking about using estrogen or estrogen with progestin treatments, discuss the benefits and risks with your health care provider. What should I know  about heart disease and stroke? Heart disease, heart attack, and stroke become more likely as you age. This may be due, in part, to the hormonal changes that your body experiences during menopause. These can affect how your body processes dietary fats, triglycerides, and cholesterol. Heart attack and stroke are both medical emergencies. There are many things that you can do to help prevent heart disease and stroke:  Have your blood pressure checked at least every 1-2 years. High blood pressure causes heart disease and increases the risk of stroke.  If you are 15-61 years old, ask your health care provider if you should take aspirin to prevent a heart attack or a stroke.  Do not use any tobacco products, including cigarettes, chewing tobacco, or electronic cigarettes. If you need help quitting, ask your health care provider.  It is important to eat a healthy diet and maintain a healthy weight. ? Be sure to include plenty of vegetables, fruits, low-fat dairy products, and lean protein. ? Avoid eating foods that are high in solid fats, added sugars, or salt (sodium).  Get regular exercise. This is one of the most important things that you can do for your health. ? Try to exercise for at least 150 minutes each week. The type of exercise that you do should increase your heart rate and make you sweat. This is known as moderate-intensity exercise. ? Try to do strengthening exercises at least twice each week. Do these in addition to the moderate-intensity exercise.  Know your numbers.Ask your health care provider to check your cholesterol and your blood glucose. Continue to have your blood tested as directed by your health care provider.  What should I know about cancer screening? There are several types of cancer. Take the following steps to reduce your risk and to catch any cancer development as early as possible. Breast Cancer  Practice breast self-awareness. ? This means understanding how your  breasts normally appear and feel. ? It also means doing regular breast self-exams. Let your health care provider know about any changes, no matter how small.  If you are 98 or older, have a clinician do a breast exam (clinical breast exam or CBE) every year. Depending on your age, family history, and medical history, it may be recommended that you also have a yearly breast X-ray (mammogram).  If you have a family history of breast cancer, talk with your health care provider about genetic screening.  If you are at high risk for breast cancer, talk with your health care provider about having an MRI and a mammogram every year.  Breast cancer (BRCA) gene test is recommended for women who have family members with BRCA-related cancers. Results of the assessment will determine the need for genetic counseling and BRCA1 and for BRCA2 testing. BRCA-related cancers include these types: ? Breast. This occurs in males or females. ? Ovarian. ? Tubal. This may also be called fallopian tube cancer. ? Cancer of the abdominal or pelvic lining (peritoneal cancer). ? Prostate. ? Pancreatic.  Cervical, Uterine, and Ovarian Cancer Your health care provider may recommend that you be screened regularly for cancer of the pelvic organs. These include your ovaries, uterus, and vagina. This screening involves a pelvic exam, which includes checking for microscopic changes to the surface of your cervix (Pap test).  For women ages 21-65, health care providers may recommend a pelvic exam and a Pap test every three years. For women ages 52-65, they may recommend the Pap test and pelvic exam, combined with testing for human papilloma virus (HPV), every five years. Some types of HPV increase your risk of cervical cancer. Testing for HPV may also be done on women of any age who have unclear Pap test results.  Other health care providers may not recommend any screening for nonpregnant women who are considered low risk for pelvic  cancer and have no symptoms. Ask your health care provider if a screening pelvic exam is right for you.  If you have had past treatment for cervical cancer or a condition that could lead to cancer, you need Pap tests and screening for cancer for at least 20 years after your treatment. If Pap tests have been discontinued for you, your risk factors (such as having a new sexual partner) need to be reassessed to determine if you should start having screenings again. Some women have medical problems that increase the chance of getting cervical cancer. In these cases, your health care provider may recommend that you have screening and Pap tests more often.  If you have a family history of uterine cancer or ovarian cancer, talk with your health care provider about genetic screening.  If you have vaginal bleeding after reaching menopause, tell your health care provider.  There are currently no reliable tests available to screen for ovarian cancer.  Lung Cancer Lung cancer screening is recommended for adults 32-56 years old who are at high risk for lung cancer because of a history of smoking. A yearly low-dose CT scan of the lungs is recommended if you:  Currently smoke.  Have a history of at least 30 pack-years of smoking and you currently smoke or have quit within the past  15 years. A pack-year is smoking an average of one pack of cigarettes per day for one year.  Yearly screening should:  Continue until it has been 15 years since you quit.  Stop if you develop a health problem that would prevent you from having lung cancer treatment.  Colorectal Cancer  This type of cancer can be detected and can often be prevented.  Routine colorectal cancer screening usually begins at age 9 and continues through age 91.  If you have risk factors for colon cancer, your health care provider may recommend that you be screened at an earlier age.  If you have a family history of colorectal cancer, talk with  your health care provider about genetic screening.  Your health care provider may also recommend using home test kits to check for hidden blood in your stool.  A small camera at the end of a tube can be used to examine your colon directly (sigmoidoscopy or colonoscopy). This is done to check for the earliest forms of colorectal cancer.  Direct examination of the colon should be repeated every 5-10 years until age 19. However, if early forms of precancerous polyps or small growths are found or if you have a family history or genetic risk for colorectal cancer, you may need to be screened more often.  Skin Cancer  Check your skin from head to toe regularly.  Monitor any moles. Be sure to tell your health care provider: ? About any new moles or changes in moles, especially if there is a change in a mole's shape or color. ? If you have a mole that is larger than the size of a pencil eraser.  If any of your family members has a history of skin cancer, especially at a young age, talk with your health care provider about genetic screening.  Always use sunscreen. Apply sunscreen liberally and repeatedly throughout the day.  Whenever you are outside, protect yourself by wearing long sleeves, pants, a wide-brimmed hat, and sunglasses.  What should I know about osteoporosis? Osteoporosis is a condition in which bone destruction happens more quickly than new bone creation. After menopause, you may be at an increased risk for osteoporosis. To help prevent osteoporosis or the bone fractures that can happen because of osteoporosis, the following is recommended:  If you are 66-54 years old, get at least 1,000 mg of calcium and at least 600 mg of vitamin D per day.  If you are older than age 59 but younger than age 81, get at least 1,200 mg of calcium and at least 600 mg of vitamin D per day.  If you are older than age 89, get at least 1,200 mg of calcium and at least 800 mg of vitamin D per  day.  Smoking and excessive alcohol intake increase the risk of osteoporosis. Eat foods that are rich in calcium and vitamin D, and do weight-bearing exercises several times each week as directed by your health care provider. What should I know about how menopause affects my mental health? Depression may occur at any age, but it is more common as you become older. Common symptoms of depression include:  Low or sad mood.  Changes in sleep patterns.  Changes in appetite or eating patterns.  Feeling an overall lack of motivation or enjoyment of activities that you previously enjoyed.  Frequent crying spells.  Talk with your health care provider if you think that you are experiencing depression. What should I know about immunizations? It is important  that you get and maintain your immunizations. These include:  Tetanus, diphtheria, and pertussis (Tdap) booster vaccine.  Influenza every year before the flu season begins.  Pneumonia vaccine.  Shingles vaccine.  Your health care provider may also recommend other immunizations. This information is not intended to replace advice given to you by your health care provider. Make sure you discuss any questions you have with your health care provider. Document Released: 11/13/2005 Document Revised: 04/10/2016 Document Reviewed: 06/25/2015 Elsevier Interactive Patient Education  2018 [employer]  

## 2017-09-24 ENCOUNTER — Telehealth: Payer: Self-pay | Admitting: Obstetrics and Gynecology

## 2017-09-24 NOTE — Telephone Encounter (Signed)
The patient would like a call from a nurse for a medication refill. No other information disclosed. Please advise.

## 2017-09-27 NOTE — Telephone Encounter (Signed)
Pt needed refill of trimosan. Per pharmacy needs rx erx. Pt had daughter do it(daughter is a DR.)

## 2017-10-13 DIAGNOSIS — L570 Actinic keratosis: Secondary | ICD-10-CM | POA: Diagnosis not present

## 2017-10-13 DIAGNOSIS — I781 Nevus, non-neoplastic: Secondary | ICD-10-CM | POA: Diagnosis not present

## 2017-10-13 DIAGNOSIS — Z85820 Personal history of malignant melanoma of skin: Secondary | ICD-10-CM | POA: Diagnosis not present

## 2017-10-13 DIAGNOSIS — Z872 Personal history of diseases of the skin and subcutaneous tissue: Secondary | ICD-10-CM | POA: Diagnosis not present

## 2017-10-13 DIAGNOSIS — L578 Other skin changes due to chronic exposure to nonionizing radiation: Secondary | ICD-10-CM | POA: Diagnosis not present

## 2017-10-13 DIAGNOSIS — L821 Other seborrheic keratosis: Secondary | ICD-10-CM | POA: Diagnosis not present

## 2017-10-13 DIAGNOSIS — B372 Candidiasis of skin and nail: Secondary | ICD-10-CM | POA: Diagnosis not present

## 2017-10-13 DIAGNOSIS — D239 Other benign neoplasm of skin, unspecified: Secondary | ICD-10-CM | POA: Diagnosis not present

## 2017-10-28 ENCOUNTER — Other Ambulatory Visit: Payer: Self-pay | Admitting: Internal Medicine

## 2017-10-28 DIAGNOSIS — N814 Uterovaginal prolapse, unspecified: Secondary | ICD-10-CM | POA: Diagnosis not present

## 2017-10-28 DIAGNOSIS — L509 Urticaria, unspecified: Secondary | ICD-10-CM | POA: Diagnosis not present

## 2017-10-28 DIAGNOSIS — Z1159 Encounter for screening for other viral diseases: Secondary | ICD-10-CM | POA: Diagnosis not present

## 2017-10-28 DIAGNOSIS — R7302 Impaired glucose tolerance (oral): Secondary | ICD-10-CM | POA: Diagnosis not present

## 2017-10-28 DIAGNOSIS — E559 Vitamin D deficiency, unspecified: Secondary | ICD-10-CM | POA: Diagnosis not present

## 2017-10-28 DIAGNOSIS — N952 Postmenopausal atrophic vaginitis: Secondary | ICD-10-CM | POA: Diagnosis not present

## 2017-10-28 DIAGNOSIS — Z0000 Encounter for general adult medical examination without abnormal findings: Secondary | ICD-10-CM | POA: Diagnosis not present

## 2017-10-28 DIAGNOSIS — E782 Mixed hyperlipidemia: Secondary | ICD-10-CM | POA: Diagnosis not present

## 2017-10-28 DIAGNOSIS — Z1231 Encounter for screening mammogram for malignant neoplasm of breast: Secondary | ICD-10-CM

## 2017-10-28 DIAGNOSIS — Z9889 Other specified postprocedural states: Secondary | ICD-10-CM | POA: Diagnosis not present

## 2017-11-11 ENCOUNTER — Ambulatory Visit
Admission: RE | Admit: 2017-11-11 | Discharge: 2017-11-11 | Disposition: A | Discharge status: Home / Self Care | Payer: Medicare HMO | Source: Ambulatory Visit | Attending: Internal Medicine | Admitting: Internal Medicine

## 2017-11-11 ENCOUNTER — Encounter (INDEPENDENT_AMBULATORY_CARE_PROVIDER_SITE_OTHER): Payer: Self-pay

## 2017-11-11 ENCOUNTER — Other Ambulatory Visit: Payer: Self-pay | Admitting: Internal Medicine

## 2017-11-11 DIAGNOSIS — Z1231 Encounter for screening mammogram for malignant neoplasm of breast: Secondary | ICD-10-CM | POA: Insufficient documentation

## 2018-01-06 ENCOUNTER — Encounter: Payer: Self-pay | Admitting: Obstetrics and Gynecology

## 2018-01-06 ENCOUNTER — Ambulatory Visit: Payer: Medicare HMO | Admitting: Obstetrics and Gynecology

## 2018-01-06 ENCOUNTER — Encounter: Payer: Medicare HMO | Admitting: Obstetrics and Gynecology

## 2018-01-06 VITALS — BP 128/74 | HR 73 | Ht 65.0 in | Wt 175.1 lb

## 2018-01-06 DIAGNOSIS — Z4689 Encounter for fitting and adjustment of other specified devices: Secondary | ICD-10-CM | POA: Diagnosis not present

## 2018-01-06 DIAGNOSIS — N952 Postmenopausal atrophic vaginitis: Secondary | ICD-10-CM

## 2018-01-06 DIAGNOSIS — N816 Rectocele: Secondary | ICD-10-CM

## 2018-01-06 DIAGNOSIS — N8110 Cystocele, unspecified: Secondary | ICD-10-CM | POA: Diagnosis not present

## 2018-01-06 NOTE — Patient Instructions (Signed)
1.  Return in 4 months for pessary maintenance 2.  Continue using Trimosan gel intravaginal once a week 3.  Continue using Premarin cream intravaginal once a week 

## 2018-01-06 NOTE — Progress Notes (Signed)
Chief complaint: 1. Pessary maintenance  (last visit 09/07/2017) 2. Cystocele  3. Vaginal atrophy  12 week pessary maintenance visit #2 ring with support pessary Using Trimosan gel intravaginal weekly Using Premarin cream intravaginal weekly Continues to feel that it is helpful in voiding more completely;no incomplete bladder emptying Patient voids every 2 hours during the day Nocturia 1 Patient reports no significant vaginal bleeding, vaginal discharge, vaginal odor, vaginal pain  OBJECTIVE: BP 128/74   Pulse 73   Ht 5\' 5"  (1.651 m)   Wt 175 lb 1.6 oz (79.4 kg)   BMI 29.14 kg/m  Pleasant well-appearing white female in no acute distress. Alert and oriented. Abdomen: Soft, nontender PELVIC: External Genitalia: left labium minora melanosis; no hyperplastic changes, ulceration, or blue/black borders BUS: Normal Vagina: Normalmucosa; second-degree cystocele; first-degree rectocele Cervix: Not examined Uterus: Not examined Adnexa: Normal RV: External Exam NormaI external exam  The #2 ring with support pessary is removed, cleaned, and reinserted  ASSESSMENT: 1. Cystocele, second degree 2. Rectocele, mild 3. Vaginal atrophy 4. Pessary maintenance, normal, with resolution of incomplete bladder emptying  PLAN: 1. #2 ring with support pessary is removed, cleaned, reinserted 2. Insert Trimosan gel intravaginal once a week 3. Insert Premarin cream intravaginal once a week 4. Return in 4 months forpessary maintenance  A total of 15 minutes were spent face-to-face with the patient during this encounter and over half of that time dealt with counseling and coordination of care.  Brayton Mars, MD  Note: This dictation was prepared with Dragon dictation along with smaller phrase technology. Any transcriptional errors that result from this process are unintentional.

## 2018-01-13 ENCOUNTER — Encounter: Payer: Medicare HMO | Admitting: Obstetrics and Gynecology

## 2018-04-14 DIAGNOSIS — Z85820 Personal history of malignant melanoma of skin: Secondary | ICD-10-CM | POA: Diagnosis not present

## 2018-04-14 DIAGNOSIS — L570 Actinic keratosis: Secondary | ICD-10-CM | POA: Diagnosis not present

## 2018-04-14 DIAGNOSIS — L578 Other skin changes due to chronic exposure to nonionizing radiation: Secondary | ICD-10-CM | POA: Diagnosis not present

## 2018-04-14 DIAGNOSIS — Z1283 Encounter for screening for malignant neoplasm of skin: Secondary | ICD-10-CM | POA: Diagnosis not present

## 2018-04-14 DIAGNOSIS — Z872 Personal history of diseases of the skin and subcutaneous tissue: Secondary | ICD-10-CM | POA: Diagnosis not present

## 2018-05-12 ENCOUNTER — Encounter: Payer: Medicare HMO | Admitting: Obstetrics and Gynecology

## 2018-05-18 ENCOUNTER — Encounter: Payer: Self-pay | Admitting: Obstetrics and Gynecology

## 2018-05-18 ENCOUNTER — Ambulatory Visit: Payer: Medicare HMO | Admitting: Obstetrics and Gynecology

## 2018-05-18 VITALS — BP 111/76 | HR 71 | Ht 65.0 in | Wt 175.6 lb

## 2018-05-18 DIAGNOSIS — N816 Rectocele: Secondary | ICD-10-CM | POA: Diagnosis not present

## 2018-05-18 DIAGNOSIS — Z4689 Encounter for fitting and adjustment of other specified devices: Secondary | ICD-10-CM

## 2018-05-18 DIAGNOSIS — N8111 Cystocele, midline: Secondary | ICD-10-CM | POA: Diagnosis not present

## 2018-05-18 NOTE — Patient Instructions (Signed)
1.  Pessary is removed, cleaned, and reinserted. 2.  Continue using Trimosan gel and Premarin cream intravaginal every 4 days, alternating creams. 3.  Return in 4 months for pessary maintenance.

## 2018-05-18 NOTE — Progress Notes (Signed)
Chief complaint: 1.  Pessary maintenance (last visit 01/06/2018) 2.  Cystocele 3.  Vaginal atrophy  Patient presents for 12-week pessary maintenance evaluation. She is using a #2 ring with support pessary. She is using Trimosan gel intravaginal weekly as well as protein Premarin cream intravaginal weekly. Patient is voiding more completely and consistently with the pessary in place.  She is not experiencing any incomplete bladder emptying. She does void approximately every 2 hours during the day and continues to have nocturia x1. Patient denies significant vaginal discharge, vaginal odor, vaginal pain, or vaginal bleeding.  Past medical history: Past surgical history, problem list, medications, and allergies are reviewed  Review of systems: Complete review of systems is negative. OBJECTIVE: BP 111/76   Pulse 71   Ht 5\' 5"  (1.651 m)   Wt 175 lb 9.6 oz (79.7 kg)   BMI 29.22 kg/m  Pleasant well-appearing female no acute distress.  Alert and oriented.  Normal affect. Abdomen: Soft, nontender without organomegaly Pelvic exam: External genitalia-prominent labia minora; melanosis of left labia and minora; no leukoplakia, ulcer, or epithelial skin breakdown. BUS-normal Vagina-healthy-appearing vaginal mucosa; second-degree cystocele; first-degree rectocele Cervix-no cervical motion tenderness Uterus-normal size and shape, mobile, nontender Adnexa-nonpalpable nontender Rectovaginal-normal external exam  PROCEDURE: #2 ring pessary with support is removed, cleaned, and reinserted.  ASSESSMENT: 1.  Second-degree cystocele, asymptomatic with pessary use 2.  Mild rectocele, asymptomatic with pessary use 3.  Vaginal atrophy, stable 4.  History of incomplete bladder emptying, resolved with pessary use  PLAN: 1.  #2 ring with support pessary is removed, cleaned, and reinserted. 2.  Trimosan gel is to be continued intravaginal weekly 3.  Permanent cream is to be continued intravaginal  weekly 4.  Return in 4 months for pessary maintenance  A total of 15 minutes were spent face-to-face with the patient during this encounter and over half of that time dealt with counseling and coordination of care.  Brayton Mars, MD  Note: This dictation was prepared with Dragon dictation along with smaller phrase technology. Any transcriptional errors that result from this process are unintentional.

## 2018-06-16 DIAGNOSIS — R69 Illness, unspecified: Secondary | ICD-10-CM | POA: Diagnosis not present

## 2018-08-03 DIAGNOSIS — H40003 Preglaucoma, unspecified, bilateral: Secondary | ICD-10-CM | POA: Diagnosis not present

## 2018-08-04 DIAGNOSIS — Z0100 Encounter for examination of eyes and vision without abnormal findings: Secondary | ICD-10-CM | POA: Diagnosis not present

## 2018-09-07 DIAGNOSIS — Z0100 Encounter for examination of eyes and vision without abnormal findings: Secondary | ICD-10-CM | POA: Diagnosis not present

## 2018-09-09 NOTE — Progress Notes (Deleted)
ANNUAL PREVENTATIVE CARE GYN  ENCOUNTER NOTE  Subjective:       Tracy Rodgers is a 72 y.o. [phone] female here for a routine annual gynecologic exam.  Current complaints:  1.  MEDICARE BREAST AND PELVIC 2. 4 MONTH PESSARY CHECK-no complaints  #2 ring with support pessary (last visit 04/27/2017) Trimosan gel intravaginally weekly Premarin cream intravaginally weekly Voiding appears to be more complete with no episodes of incomplete bladder emptying. Patient voids every 2 hours during the day. No nocturia. No significant vaginal bleeding, vaginal odor, or vaginal pain.   Gynecologic History No LMP recorded. Patient is postmenopausal. Contraception: post menopausal status Last YHC:WCBJSEG. Results were: normal Last mammogram: 11/2017. birad 1  Results were: normal  Obstetric History OB History  Gravida Para Term Preterm AB Living  3 3 3     3   SAB TAB Ectopic Multiple Live Births          3    # Outcome Date GA Lbr Len/2nd Weight Sex Delivery Anes PTL Lv  3 Term 1964   7 lb (3.175 kg)  Vag-Spont   LIV  2 Term 1963   4 lb (1.814 kg)  Vag-Spont   LIV  1 Term 1962   6 lb (2.722 kg)  Vag-Spont   LIV    Past Medical History:  Diagnosis Date  . Achilles tendinitis   . Arthritis   . Cystocele, unspecified (CODE)   . Diverticulosis   . Dyspareunia, female   . Elevated cholesterol   . History of heavy periods   . Melanoma ([place])    on her back  . Painful menstrual periods   . Rectocele     Past Surgical History:  Procedure Laterality Date  . APPENDECTOMY    . BREAST BIOPSY Left 1991   neg  . broken nose    . SKIN CANCER EXCISION      Current Outpatient Medications on File Prior to Visit  Medication Sig Dispense Refill  . cetirizine (ZYRTEC) 10 MG tablet Take 10 mg by mouth daily.    Marland Kitchen conjugated estrogens (PREMARIN) vaginal cream Place vaginally.    . Oxyquinolone Sulfate (TRIMO-SAN VA) Place vaginally.    Marland Kitchen scopolamine (TRANSDERM-SCOP, 1.5 MG,) 1 MG/3DAYS PLACE  1 PATCH (1.5 MG TOTAL) ONTO THE SKIN EVERY THIRD DAY.     No current facility-administered medications on file prior to visit.     Allergies  Allergen Reactions  . Nsaids Rash  . Oxycodone-Acetaminophen Nausea Only and Nausea And Vomiting  . Aspirin   . Ibuprofen Hives    Social History   Socioeconomic History  . Marital status: Married    Spouse name: Not on file  . Number of children: Not on file  . Years of education: Not on file  . Highest education level: Not on file  Occupational History  . Not on file  Social Needs  . Financial resource strain: Not on file  . Food insecurity:    Worry: Not on file    Inability: Not on file  . Transportation needs:    Medical: Not on file    Non-medical: Not on file  Tobacco Use  . Smoking status: Never Smoker  . Smokeless tobacco: Never Used  Substance and Sexual Activity  . Alcohol use: Yes    Comment: occas  . Drug use: No  . Sexual activity: Yes    Birth control/protection: Post-menopausal  Lifestyle  . Physical activity:    Days per week: 0 days  Minutes per session: 0 min  . Stress: Not on file  Relationships  . Social connections:    Talks on phone: Not on file    Gets together: Not on file    Attends religious service: Not on file    Active member of club or organization: Not on file    Attends meetings of clubs or organizations: Not on file    Relationship status: Not on file  . Intimate partner violence:    Fear of current or ex partner: Not on file    Emotionally abused: Not on file    Physically abused: Not on file    Forced sexual activity: Not on file  Other Topics Concern  . Not on file  Social History Narrative  . Not on file    Family History  Problem Relation Age of Onset  . Colon cancer Father   . Colon cancer Sister   . Breast cancer Neg Hx   . Diabetes Neg Hx   . Ovarian cancer Neg Hx   . Heart disease Neg Hx     The following portions of the patient's history were reviewed and  updated as appropriate: allergies, current medications, past family history, past medical history, past social history, past surgical history and problem list.  Review of Systems ROS Review of Systems - General ROS: negative for - chills, fatigue, fever, hot flashes, night sweats, weight gain or weight loss Psychological ROS: negative for - anxiety, decreased libido, depression, mood swings, physical abuse or sexual abuse Ophthalmic ROS: negative for - blurry vision, eye pain or loss of vision ENT ROS: negative for - headaches, hearing change, visual changes or vocal changes Allergy and Immunology ROS: negative for - hives, itchy/watery eyes or seasonal allergies Hematological and Lymphatic ROS: negative for - bleeding problems, bruising, swollen lymph nodes or weight loss Endocrine ROS: negative for - galactorrhea, hair pattern changes, hot flashes, malaise/lethargy, mood swings, palpitations, polydipsia/polyuria, skin changes, temperature intolerance or unexpected weight changes Breast ROS: negative for - new or changing breast lumps or nipple discharge Respiratory ROS: negative for - cough or shortness of breath Cardiovascular ROS: negative for - chest pain, irregular heartbeat, palpitations or shortness of breath Gastrointestinal ROS: no abdominal pain, change in bowel habits, or black or bloody stools Genito-Urinary ROS: no dysuria, trouble voiding, or hematuria Musculoskeletal ROS: negative for - joint pain or joint stiffness Neurological ROS: negative for - bowel and bladder control changes Dermatological ROS: negative for rash and skin lesion changes   Objective:   There were no vitals taken for this visit. CONSTITUTIONAL: Well-developed, well-nourished female in no acute distress.  PSYCHIATRIC: Normal mood and affect. Normal behavior. Normal judgment and thought content. [place]: Alert and oriented to person, place, and time. Normal muscle tone coordination. No cranial nerve  deficit noted. HENT:  Normocephalic, atraumatic, External right and left ear normal. Oropharynx is clear and moist EYES: Conjunctivae and EOM are normal. No scleral icterus.  NECK: Normal range of motion, supple, no masses.  Normal thyroid.  SKIN: Skin is warm and dry. No rash noted. Not diaphoretic. No erythema. No pallor. CARDIOVASCULAR: Normal heart rate noted, regular rhythm, no murmur. RESPIRATORY: Clear to auscultation bilaterally. Effort and breath sounds normal, no problems with respiration noted. BREASTS: Symmetric in size. No masses, skin changes, nipple drainage, or lymphadenopathy. ABDOMEN: Soft, normal bowel sounds, no distention noted.  No tenderness, rebound or guarding.  BLADDER: Normal PELVIC:  External Genitalia: Normal  BUS: Normal  Vagina: Normal; good estrogen  effect; Premarin cream and vaginal vault  Cervix: Normal; no lesions; no cervical motion tenderness  Uterus: Normal; midplane, normal size shape, mobile, nontender  Adnexa: Normal; nonpalpable and nontender  RV: External Exam NormaI, No Rectal Masses and Normal Sphincter tone  MUSCULOSKELETAL: Normal range of motion. No tenderness.  No cyanosis, clubbing, or edema.  2+ distal pulses. LYMPHATIC: No Axillary, Supraclavicular, or Inguinal Adenopathy.  PROCEDURE: Pessary is removed, cleaned, and reinserted (#2 ring with support diaphragm pessary)  Assessment:   Annual gynecologic examination 72 y.o. Contraception: post menopausal status bmi 29  #2 ring with support pessary-maintenance Problem List Items Addressed This Visit    None      Plan:  Pap: Not needed Mammogram: thru pcp- due in 11/2017 Stool Guaiac Testing:  Ordered Labs: thru pcp Routine preventative health maintenance measures emphasized: Exercise/Diet/Weight control, Tobacco Warnings and Alcohol/Substance use risks Continue with Premarin cream intravaginally once a week Continue with Trimosan gel intravaginally once a week Return in 4 months  for pessary maintenance Return to [place], [place] acting as scribe for Dr. Enzo Bi.   Note: This dictation was prepared with Dragon dictation along with smaller phrase technology. Any transcriptional errors that result from this process are unintentional.

## 2018-09-12 NOTE — Progress Notes (Signed)
ANNUAL PREVENTATIVE CARE GYN  ENCOUNTER NOTE  Subjective:       Tracy Rodgers is a 72 y.o. [phone] female here for a routine annual gynecologic exam.    Current complaints: None  1.  MEDICARE BREAST AND PELVIC 2. 4 MONTH PESSARY CHECK-no complaints  #2 ring with support pessary (last visit 05/18/2018) Trimosan gel intravaginally weekly Premarin cream intravaginally weekly Voiding appears to be more complete with no episodes of incomplete bladder emptying. Patient voids every 2 hours during the day. No nocturia. No significant vaginal bleeding, vaginal odor, or vaginal pain.  Patient did report 2 small nodules palpable on the right labia majora over the past several days.  Lesions are not painful   Gynecologic History No LMP recorded. Patient is postmenopausal. Contraception: post menopausal status Last YBW:LSLHTDS. Results were: normal Last mammogram: 11/2016. birad 1  Results were: normal  Obstetric History OB History  Gravida Para Term Preterm AB Living  3 3 3     3   SAB TAB Ectopic Multiple Live Births          3    # Outcome Date GA Lbr Len/2nd Weight Sex Delivery Anes PTL Lv  3 Term 1964   7 lb (3.175 kg)  Vag-Spont   LIV  2 Term 1963   4 lb (1.814 kg)  Vag-Spont   LIV  1 Term 1962   6 lb (2.722 kg)  Vag-Spont   LIV    Past Medical History:  Diagnosis Date  . Achilles tendinitis   . Arthritis   . Cystocele, unspecified (CODE)   . Diverticulosis   . Dyspareunia, female   . Elevated cholesterol   . History of heavy periods   . Melanoma ([place])    on her back  . Painful menstrual periods   . Rectocele     Past Surgical History:  Procedure Laterality Date  . APPENDECTOMY    . BREAST BIOPSY Left 1991   neg  . broken nose    . SKIN CANCER EXCISION      Current Outpatient Medications on File Prior to Visit  Medication Sig Dispense Refill  . cetirizine (ZYRTEC) 10 MG tablet Take 10 mg by mouth daily.    Marland Kitchen conjugated estrogens (PREMARIN) vaginal cream  Place vaginally.    . Oxyquinolone Sulfate (TRIMO-SAN VA) Place vaginally.    Marland Kitchen scopolamine (TRANSDERM-SCOP, 1.5 MG,) 1 MG/3DAYS PLACE 1 PATCH (1.5 MG TOTAL) ONTO THE SKIN EVERY THIRD DAY.     No current facility-administered medications on file prior to visit.     Allergies  Allergen Reactions  . Nsaids Rash  . Oxycodone-Acetaminophen Nausea Only and Nausea And Vomiting  . Aspirin   . Ibuprofen Hives    Social History   Socioeconomic History  . Marital status: Married    Spouse name: Not on file  . Number of children: Not on file  . Years of education: Not on file  . Highest education level: Not on file  Occupational History  . Not on file  Social Needs  . Financial resource strain: Not on file  . Food insecurity:    Worry: Not on file    Inability: Not on file  . Transportation needs:    Medical: Not on file    Non-medical: Not on file  Tobacco Use  . Smoking status: Never Smoker  . Smokeless tobacco: Never Used  Substance and Sexual Activity  . Alcohol use: Yes    Comment: occas  . Drug use:  No  . Sexual activity: Yes    Birth control/protection: Post-menopausal  Lifestyle  . Physical activity:    Days per week: 0 days    Minutes per session: 0 min  . Stress: Not on file  Relationships  . Social connections:    Talks on phone: Not on file    Gets together: Not on file    Attends religious service: Not on file    Active member of club or organization: Not on file    Attends meetings of clubs or organizations: Not on file    Relationship status: Not on file  . Intimate partner violence:    Fear of current or ex partner: Not on file    Emotionally abused: Not on file    Physically abused: Not on file    Forced sexual activity: Not on file  Other Topics Concern  . Not on file  Social History Narrative  . Not on file    Family History  Problem Relation Age of Onset  . Colon cancer Father   . Colon cancer Sister   . Breast cancer Neg Hx   . Diabetes  Neg Hx   . Ovarian cancer Neg Hx   . Heart disease Neg Hx     The following portions of the patient's history were reviewed and updated as appropriate: allergies, current medications, past family history, past medical history, past social history, past surgical history and problem list.  Review of Systems Review of Systems  Constitutional: Negative for chills, diaphoresis, fever and malaise/fatigue.  Respiratory: Negative.   Cardiovascular: Negative.   Gastrointestinal: Negative.   Genitourinary: Negative.   Musculoskeletal: Negative.   Skin: Negative.   All other systems reviewed and are negative.     Objective:   BP 115/75   Pulse 68   Ht 5\' 5"  (1.651 m)   Wt 178 lb 8 oz (81 kg)   BMI 29.70 kg/m  CONSTITUTIONAL: Well-developed, well-nourished female in no acute distress.  PSYCHIATRIC: Normal mood and affect. Normal behavior. Normal judgment and thought content. [place]: Alert and oriented to person, place, and time. Normal muscle tone coordination. No cranial nerve deficit noted. HENT:  Normocephalic, atraumatic, External right and left ear normal.  EYES: Conjunctivae and EOM are normal. No scleral icterus.  NECK: Normal range of motion, supple, no masses.  Normal thyroid.  SKIN: Skin is warm and dry. No rash noted. Not diaphoretic. No erythema. No pallor. CARDIOVASCULAR: Normal heart rate noted, regular rhythm, no murmur. RESPIRATORY: Clear to auscultation bilaterally. Effort and breath sounds normal, no problems with respiration noted. BREASTS: Symmetric in size. No masses, skin changes, nipple drainage, or lymphadenopathy. ABDOMEN: Soft, normal bowel sounds, no distention noted.  No tenderness, rebound or guarding.  BLADDER: Normal PELVIC:  External Genitalia: Normal; no palpable nodules identified on the labia minora or labia majora  BUS: Normal  Vagina: Normal; good estrogen effect; first to second-degree cystocele  Cervix: Normal; no lesions; no cervical motion  tenderness  Uterus: Normal; midplane, normal size shape, mobile, nontender  Adnexa: Normal; nonpalpable and nontender  RV: External Exam NormaI, No Rectal Masses and Normal Sphincter tone  MUSCULOSKELETAL: Normal range of motion. No tenderness.  No cyanosis, clubbing, or edema.  2+ distal pulses. LYMPHATIC: No Axillary, Supraclavicular, or Inguinal Adenopathy.  PROCEDURE: Pessary is removed, cleaned, and reinserted (#2 ring with support diaphragm pessary)  Assessment:   Annual gynecologic examination 72 y.o. Contraception: post menopausal status bmi 29  #2 ring with support pessary-maintenance Midline cystocele,  asymptomatic with pessary use  Plan:  Pap: Not needed Mammogram: thru pcp- due in 11/2017 Stool Guaiac Testing:  Ordered Labs: thru pcp Routine preventative health maintenance measures emphasized: Exercise/Diet/Weight control, Tobacco Warnings and Alcohol/Substance use risks Continue with Premarin cream intravaginally once a week Continue with Trimosan gel intravaginally once a week Return in 4 months for pessary maintenance-Dr. Marcelline Mates Return to [place], [place] acting as scribe for Dr. Enzo Bi. I reviewed, updated, and concur with information scribed. Brayton Mars, MD   Note: This dictation was prepared with Dragon dictation along with smaller phrase technology. Any transcriptional errors that result from this process are unintentional.

## 2018-09-14 ENCOUNTER — Encounter: Payer: Self-pay | Admitting: Obstetrics and Gynecology

## 2018-09-14 ENCOUNTER — Telehealth: Payer: Self-pay | Admitting: Obstetrics and Gynecology

## 2018-09-14 ENCOUNTER — Ambulatory Visit (INDEPENDENT_AMBULATORY_CARE_PROVIDER_SITE_OTHER): Payer: Medicare HMO | Admitting: Obstetrics and Gynecology

## 2018-09-14 VITALS — BP 115/75 | HR 68 | Ht 65.0 in | Wt 178.5 lb

## 2018-09-14 DIAGNOSIS — Z1239 Encounter for other screening for malignant neoplasm of breast: Secondary | ICD-10-CM

## 2018-09-14 DIAGNOSIS — Z01419 Encounter for gynecological examination (general) (routine) without abnormal findings: Secondary | ICD-10-CM | POA: Diagnosis not present

## 2018-09-14 DIAGNOSIS — Z1211 Encounter for screening for malignant neoplasm of colon: Secondary | ICD-10-CM

## 2018-09-14 MED ORDER — ESTROGENS, CONJUGATED 0.625 MG/GM VA CREA: TOPICAL_CREAM | Freq: Every day | VAGINAL | 2 refills | Status: DC

## 2018-09-14 MED ORDER — SCOPOLAMINE 1 MG/3DAYS TD PT72: MEDICATED_PATCH | TRANSDERMAL | 0 refills | Status: AC

## 2018-09-14 MED ORDER — ESTROGENS, CONJUGATED 0.625 MG/GM VA CREA: TOPICAL_CREAM | Freq: Every day | VAGINAL | 2 refills | Status: AC

## 2018-09-14 NOTE — Telephone Encounter (Signed)
**  NOT URGENT** per patient She wants to speak with her nurse about her prescriptions, please advise, thanks.

## 2018-09-14 NOTE — Patient Instructions (Signed)
1.  Pap smear is not done. 2.  Mammogram is ordered. 3.  Stool guaiac card testing for colon cancer screening is ordered. 4.  Screening labs are obtained to primary care. 5.  Continue with healthy eating and exercise. 6.  Continue with Premarin cream intravaginal once a week 7.  Continue with Trimosan gel intravaginal once a week 8.  The pessary is removed, cleaned, and reinserted today. 9.  Return in 6 months for follow-up-Dr. Marcelline Mates 10.  Return in 1 year for annual exam.   Health Maintenance for Postmenopausal Women Menopause is a normal process in which your reproductive ability comes to an end. This process happens gradually over a span of months to years, usually between the ages of 3 and 44. Menopause is complete when you have missed 12 consecutive menstrual periods. It is important to talk with your health care provider about some of the most common conditions that affect postmenopausal women, such as heart disease, cancer, and bone loss (osteoporosis). Adopting a healthy lifestyle and getting preventive care can help to promote your health and wellness. Those actions can also lower your chances of developing some of these common conditions. What should I know about menopause? During menopause, you may experience a number of symptoms, such as:  Moderate-to-severe hot flashes.  Night sweats.  Decrease in sex drive.  Mood swings.  Headaches.  Tiredness.  Irritability.  Memory problems.  Insomnia.  Choosing to treat or not to treat menopausal changes is an individual decision that you make with your health care provider. What should I know about hormone replacement therapy and supplements? Hormone therapy products are effective for treating symptoms that are associated with menopause, such as hot flashes and night sweats. Hormone replacement carries certain risks, especially as you become older. If you are thinking about using estrogen or estrogen with progestin treatments,  discuss the benefits and risks with your health care provider. What should I know about heart disease and stroke? Heart disease, heart attack, and stroke become more likely as you age. This may be due, in part, to the hormonal changes that your body experiences during menopause. These can affect how your body processes dietary fats, triglycerides, and cholesterol. Heart attack and stroke are both medical emergencies. There are many things that you can do to help prevent heart disease and stroke:  Have your blood pressure checked at least every 1-2 years. High blood pressure causes heart disease and increases the risk of stroke.  If you are 57-26 years old, ask your health care provider if you should take aspirin to prevent a heart attack or a stroke.  Do not use any tobacco products, including cigarettes, chewing tobacco, or electronic cigarettes. If you need help quitting, ask your health care provider.  It is important to eat a healthy diet and maintain a healthy weight. ? Be sure to include plenty of vegetables, fruits, low-fat dairy products, and lean protein. ? Avoid eating foods that are high in solid fats, added sugars, or salt (sodium).  Get regular exercise. This is one of the most important things that you can do for your health. ? Try to exercise for at least 150 minutes each week. The type of exercise that you do should increase your heart rate and make you sweat. This is known as moderate-intensity exercise. ? Try to do strengthening exercises at least twice each week. Do these in addition to the moderate-intensity exercise.  Know your numbers.Ask your health care provider to check your cholesterol and  your blood glucose. Continue to have your blood tested as directed by your health care provider.  What should I know about cancer screening? There are several types of cancer. Take the following steps to reduce your risk and to catch any cancer development as early as  possible. Breast Cancer  Practice breast self-awareness. ? This means understanding how your breasts normally appear and feel. ? It also means doing regular breast self-exams. Let your health care provider know about any changes, no matter how small.  If you are 108 or older, have a clinician do a breast exam (clinical breast exam or CBE) every year. Depending on your age, family history, and medical history, it may be recommended that you also have a yearly breast X-ray (mammogram).  If you have a family history of breast cancer, talk with your health care provider about genetic screening.  If you are at high risk for breast cancer, talk with your health care provider about having an MRI and a mammogram every year.  Breast cancer (BRCA) gene test is recommended for women who have family members with BRCA-related cancers. Results of the assessment will determine the need for genetic counseling and BRCA1 and for BRCA2 testing. BRCA-related cancers include these types: ? Breast. This occurs in males or females. ? Ovarian. ? Tubal. This may also be called fallopian tube cancer. ? Cancer of the abdominal or pelvic lining (peritoneal cancer). ? Prostate. ? Pancreatic.  Cervical, Uterine, and Ovarian Cancer Your health care provider may recommend that you be screened regularly for cancer of the pelvic organs. These include your ovaries, uterus, and vagina. This screening involves a pelvic exam, which includes checking for microscopic changes to the surface of your cervix (Pap test).  For women ages 21-65, health care providers may recommend a pelvic exam and a Pap test every three years. For women ages 52-65, they may recommend the Pap test and pelvic exam, combined with testing for human papilloma virus (HPV), every five years. Some types of HPV increase your risk of cervical cancer. Testing for HPV may also be done on women of any age who have unclear Pap test results.  Other health care  providers may not recommend any screening for nonpregnant women who are considered low risk for pelvic cancer and have no symptoms. Ask your health care provider if a screening pelvic exam is right for you.  If you have had past treatment for cervical cancer or a condition that could lead to cancer, you need Pap tests and screening for cancer for at least 20 years after your treatment. If Pap tests have been discontinued for you, your risk factors (such as having a new sexual partner) need to be reassessed to determine if you should start having screenings again. Some women have medical problems that increase the chance of getting cervical cancer. In these cases, your health care provider may recommend that you have screening and Pap tests more often.  If you have a family history of uterine cancer or ovarian cancer, talk with your health care provider about genetic screening.  If you have vaginal bleeding after reaching menopause, tell your health care provider.  There are currently no reliable tests available to screen for ovarian cancer.  Lung Cancer Lung cancer screening is recommended for adults 34-65 years old who are at high risk for lung cancer because of a history of smoking. A yearly low-dose CT scan of the lungs is recommended if you:  Currently smoke.  Have a history  of at least 30 pack-years of smoking and you currently smoke or have quit within the past 15 years. A pack-year is smoking an average of one pack of cigarettes per day for one year.  Yearly screening should:  Continue until it has been 15 years since you quit.  Stop if you develop a health problem that would prevent you from having lung cancer treatment.  Colorectal Cancer  This type of cancer can be detected and can often be prevented.  Routine colorectal cancer screening usually begins at age 72 and continues through age 32.  If you have risk factors for colon cancer, your health care provider may recommend  that you be screened at an earlier age.  If you have a family history of colorectal cancer, talk with your health care provider about genetic screening.  Your health care provider may also recommend using home test kits to check for hidden blood in your stool.  A small camera at the end of a tube can be used to examine your colon directly (sigmoidoscopy or colonoscopy). This is done to check for the earliest forms of colorectal cancer.  Direct examination of the colon should be repeated every 5-10 years until age 64. However, if early forms of precancerous polyps or small growths are found or if you have a family history or genetic risk for colorectal cancer, you may need to be screened more often.  Skin Cancer  Check your skin from head to toe regularly.  Monitor any moles. Be sure to tell your health care provider: ? About any new moles or changes in moles, especially if there is a change in a mole's shape or color. ? If you have a mole that is larger than the size of a pencil eraser.  If any of your family members has a history of skin cancer, especially at a young age, talk with your health care provider about genetic screening.  Always use sunscreen. Apply sunscreen liberally and repeatedly throughout the day.  Whenever you are outside, protect yourself by wearing long sleeves, pants, a wide-brimmed hat, and sunglasses.  What should I know about osteoporosis? Osteoporosis is a condition in which bone destruction happens more quickly than new bone creation. After menopause, you may be at an increased risk for osteoporosis. To help prevent osteoporosis or the bone fractures that can happen because of osteoporosis, the following is recommended:  If you are 41-73 years old, get at least 1,000 mg of calcium and at least 600 mg of vitamin D per day.  If you are older than age 101 but younger than age 11, get at least 1,200 mg of calcium and at least 600 mg of vitamin D per day.  If you  are older than age 60, get at least 1,200 mg of calcium and at least 800 mg of vitamin D per day.  Smoking and excessive alcohol intake increase the risk of osteoporosis. Eat foods that are rich in calcium and vitamin D, and do weight-bearing exercises several times each week as directed by your health care provider. What should I know about how menopause affects my mental health? Depression may occur at any age, but it is more common as you become older. Common symptoms of depression include:  Low or sad mood.  Changes in sleep patterns.  Changes in appetite or eating patterns.  Feeling an overall lack of motivation or enjoyment of activities that you previously enjoyed.  Frequent crying spells.  Talk with your health care provider  if you think that you are experiencing depression. What should I know about immunizations? It is important that you get and maintain your immunizations. These include:  Tetanus, diphtheria, and pertussis (Tdap) booster vaccine.  Influenza every year before the flu season begins.  Pneumonia vaccine.  Shingles vaccine.  Your health care provider may also recommend other immunizations. This information is not intended to replace advice given to you by your health care provider. Make sure you discuss any questions you have with your health care provider. Document Released: 11/13/2005 Document Revised: 04/10/2016 Document Reviewed: 06/25/2015 Elsevier Interactive Patient Education  2018 [employer]  

## 2018-09-14 NOTE — Telephone Encounter (Signed)
Spoke with patient and let her know that premarin was sent to [place]. She will call the pharmacy to tell them to put it on hold.

## 2018-09-15 ENCOUNTER — Encounter: Payer: Medicare HMO | Admitting: Obstetrics and Gynecology

## 2018-09-21 ENCOUNTER — Encounter: Payer: Medicare HMO | Admitting: Obstetrics and Gynecology

## 2018-10-11 ENCOUNTER — Other Ambulatory Visit: Payer: Self-pay | Admitting: Internal Medicine

## 2018-10-11 DIAGNOSIS — Z1231 Encounter for screening mammogram for malignant neoplasm of breast: Secondary | ICD-10-CM

## 2018-10-28 ENCOUNTER — Telehealth: Payer: Self-pay | Admitting: Obstetrics and Gynecology

## 2018-10-28 NOTE — Telephone Encounter (Signed)
Spoke with pt concerning her issues. Pt stated that she is having pain in her abd area when she moves. Pt was asked if she ate something that could have caused the pain. Pt stated the foods that she has ate in the last 3 days since she has noticed the pain. Pt was advised to try Gas-X and call her PCP to make an appointment to be seen since [facility] schedule was full for the next 3 weeks. Pt stated that she was just concerned that it was her pessary.

## 2018-10-28 NOTE — Telephone Encounter (Signed)
The patient called and stated that she is having mid abdominal pain and had a few concerns on wether or not it had anything to do with her pessary. The patient is hoping to receive a call back today if possible due to the severity of her symptoms. Please advise.

## 2018-11-03 DIAGNOSIS — Z0000 Encounter for general adult medical examination without abnormal findings: Secondary | ICD-10-CM | POA: Diagnosis not present

## 2018-11-03 DIAGNOSIS — N8111 Cystocele, midline: Secondary | ICD-10-CM | POA: Diagnosis not present

## 2018-11-03 DIAGNOSIS — E559 Vitamin D deficiency, unspecified: Secondary | ICD-10-CM | POA: Diagnosis not present

## 2018-11-03 DIAGNOSIS — Z23 Encounter for immunization: Secondary | ICD-10-CM | POA: Diagnosis not present

## 2018-11-03 DIAGNOSIS — R7302 Impaired glucose tolerance (oral): Secondary | ICD-10-CM | POA: Diagnosis not present

## 2018-11-03 DIAGNOSIS — N814 Uterovaginal prolapse, unspecified: Secondary | ICD-10-CM | POA: Diagnosis not present

## 2018-11-03 DIAGNOSIS — E782 Mixed hyperlipidemia: Secondary | ICD-10-CM | POA: Diagnosis not present

## 2018-11-24 DIAGNOSIS — H6983 Other specified disorders of Eustachian tube, bilateral: Secondary | ICD-10-CM | POA: Diagnosis not present

## 2018-11-24 DIAGNOSIS — J069 Acute upper respiratory infection, unspecified: Secondary | ICD-10-CM | POA: Diagnosis not present

## 2018-12-07 ENCOUNTER — Ambulatory Visit: Payer: Medicare HMO

## 2018-12-07 ENCOUNTER — Ambulatory Visit
Admission: RE | Admit: 2018-12-07 | Discharge: 2018-12-07 | Disposition: A | Discharge status: Home / Self Care | Payer: Medicare HMO | Source: Ambulatory Visit | Attending: Internal Medicine | Admitting: Internal Medicine

## 2018-12-07 DIAGNOSIS — Z1231 Encounter for screening mammogram for malignant neoplasm of breast: Secondary | ICD-10-CM | POA: Diagnosis not present

## 2019-01-23 ENCOUNTER — Telehealth: Payer: Self-pay | Admitting: Obstetrics and Gynecology

## 2019-01-23 NOTE — Telephone Encounter (Signed)
The patient called and stated that she she needs to speak with Martin Majestic in regards to an unexpected/error bill. The patient stated that a preventative care apt was coded wrong and she received a bill for 300.00 +. The patient is requesting a call back after she was informed by her insurance to contact the office for a solution. Please advise.

## 2019-02-07 DIAGNOSIS — B029 Zoster without complications: Secondary | ICD-10-CM | POA: Diagnosis not present

## 2019-02-20 DIAGNOSIS — Z872 Personal history of diseases of the skin and subcutaneous tissue: Secondary | ICD-10-CM | POA: Diagnosis not present

## 2019-02-20 DIAGNOSIS — Z79899 Other long term (current) drug therapy: Secondary | ICD-10-CM | POA: Diagnosis not present

## 2019-02-20 DIAGNOSIS — L918 Other hypertrophic disorders of the skin: Secondary | ICD-10-CM | POA: Diagnosis not present

## 2019-02-20 DIAGNOSIS — L578 Other skin changes due to chronic exposure to nonionizing radiation: Secondary | ICD-10-CM | POA: Diagnosis not present

## 2019-02-20 DIAGNOSIS — L570 Actinic keratosis: Secondary | ICD-10-CM | POA: Diagnosis not present

## 2019-02-21 DIAGNOSIS — R05 Cough: Secondary | ICD-10-CM | POA: Diagnosis not present

## 2019-03-16 ENCOUNTER — Encounter: Payer: Medicare HMO | Admitting: Obstetrics and Gynecology

## 2019-03-16 DIAGNOSIS — Z4689 Encounter for fitting and adjustment of other specified devices: Secondary | ICD-10-CM | POA: Diagnosis not present

## 2019-03-16 DIAGNOSIS — N8110 Cystocele, unspecified: Secondary | ICD-10-CM | POA: Diagnosis not present

## 2019-03-17 ENCOUNTER — Encounter: Payer: Medicare HMO | Admitting: Obstetrics and Gynecology

## 2019-06-06 DIAGNOSIS — Z0100 Encounter for examination of eyes and vision without abnormal findings: Secondary | ICD-10-CM | POA: Diagnosis not present

## 2019-09-19 DIAGNOSIS — Z1331 Encounter for screening for depression: Secondary | ICD-10-CM | POA: Diagnosis not present

## 2019-09-19 DIAGNOSIS — Z4689 Encounter for fitting and adjustment of other specified devices: Secondary | ICD-10-CM | POA: Diagnosis not present

## 2019-09-19 DIAGNOSIS — Z124 Encounter for screening for malignant neoplasm of cervix: Secondary | ICD-10-CM | POA: Diagnosis not present

## 2019-09-19 DIAGNOSIS — Z1211 Encounter for screening for malignant neoplasm of colon: Secondary | ICD-10-CM | POA: Diagnosis not present

## 2019-09-21 ENCOUNTER — Encounter: Payer: Medicare HMO | Admitting: Obstetrics and Gynecology

## 2019-09-21 DIAGNOSIS — Z872 Personal history of diseases of the skin and subcutaneous tissue: Secondary | ICD-10-CM | POA: Diagnosis not present

## 2019-09-21 DIAGNOSIS — Z85820 Personal history of malignant melanoma of skin: Secondary | ICD-10-CM | POA: Diagnosis not present

## 2019-09-21 DIAGNOSIS — L578 Other skin changes due to chronic exposure to nonionizing radiation: Secondary | ICD-10-CM | POA: Diagnosis not present

## 2019-09-21 DIAGNOSIS — L821 Other seborrheic keratosis: Secondary | ICD-10-CM | POA: Diagnosis not present

## 2019-09-21 DIAGNOSIS — L570 Actinic keratosis: Secondary | ICD-10-CM | POA: Diagnosis not present

## 2019-11-02 ENCOUNTER — Other Ambulatory Visit: Payer: Self-pay | Admitting: Internal Medicine

## 2019-11-02 DIAGNOSIS — Z1231 Encounter for screening mammogram for malignant neoplasm of breast: Secondary | ICD-10-CM

## 2019-11-06 DIAGNOSIS — Z0000 Encounter for general adult medical examination without abnormal findings: Secondary | ICD-10-CM | POA: Diagnosis not present

## 2019-11-06 DIAGNOSIS — E782 Mixed hyperlipidemia: Secondary | ICD-10-CM | POA: Diagnosis not present

## 2019-11-06 DIAGNOSIS — N814 Uterovaginal prolapse, unspecified: Secondary | ICD-10-CM | POA: Diagnosis not present

## 2019-11-06 DIAGNOSIS — Z86010 Personal history of colonic polyps: Secondary | ICD-10-CM | POA: Diagnosis not present

## 2019-11-06 DIAGNOSIS — N8111 Cystocele, midline: Secondary | ICD-10-CM | POA: Diagnosis not present

## 2019-11-06 DIAGNOSIS — E559 Vitamin D deficiency, unspecified: Secondary | ICD-10-CM | POA: Diagnosis not present

## 2019-11-06 DIAGNOSIS — Z79899 Other long term (current) drug therapy: Secondary | ICD-10-CM | POA: Diagnosis not present

## 2019-11-06 DIAGNOSIS — R7302 Impaired glucose tolerance (oral): Secondary | ICD-10-CM | POA: Diagnosis not present

## 2019-11-06 DIAGNOSIS — J301 Allergic rhinitis due to pollen: Secondary | ICD-10-CM | POA: Diagnosis not present

## 2019-12-12 ENCOUNTER — Ambulatory Visit
Admission: RE | Admit: 2019-12-12 | Discharge: 2019-12-12 | Disposition: A | Discharge status: Home / Self Care | Payer: Medicare HMO | Source: Ambulatory Visit | Attending: Internal Medicine | Admitting: Internal Medicine

## 2019-12-12 ENCOUNTER — Encounter (INDEPENDENT_AMBULATORY_CARE_PROVIDER_SITE_OTHER): Payer: Self-pay

## 2019-12-12 ENCOUNTER — Other Ambulatory Visit: Payer: Self-pay

## 2019-12-12 DIAGNOSIS — Z1231 Encounter for screening mammogram for malignant neoplasm of breast: Secondary | ICD-10-CM | POA: Diagnosis not present

## 2020-02-20 DIAGNOSIS — E782 Mixed hyperlipidemia: Secondary | ICD-10-CM | POA: Diagnosis not present

## 2020-02-20 DIAGNOSIS — Z79899 Other long term (current) drug therapy: Secondary | ICD-10-CM | POA: Diagnosis not present

## 2020-03-21 DIAGNOSIS — Z85820 Personal history of malignant melanoma of skin: Secondary | ICD-10-CM | POA: Diagnosis not present

## 2020-03-21 DIAGNOSIS — L578 Other skin changes due to chronic exposure to nonionizing radiation: Secondary | ICD-10-CM | POA: Diagnosis not present

## 2020-03-21 DIAGNOSIS — L570 Actinic keratosis: Secondary | ICD-10-CM | POA: Diagnosis not present

## 2020-03-21 DIAGNOSIS — Z872 Personal history of diseases of the skin and subcutaneous tissue: Secondary | ICD-10-CM | POA: Diagnosis not present

## 2020-03-21 DIAGNOSIS — L814 Other melanin hyperpigmentation: Secondary | ICD-10-CM | POA: Diagnosis not present

## 2020-03-27 DIAGNOSIS — M8588 Other specified disorders of bone density and structure, other site: Secondary | ICD-10-CM | POA: Diagnosis not present

## 2020-04-09 DIAGNOSIS — Z4689 Encounter for fitting and adjustment of other specified devices: Secondary | ICD-10-CM | POA: Diagnosis not present

## 2020-04-15 DIAGNOSIS — H5213 Myopia, bilateral: Secondary | ICD-10-CM | POA: Diagnosis not present

## 2020-06-19 DIAGNOSIS — Z86010 Personal history of colonic polyps: Secondary | ICD-10-CM | POA: Diagnosis not present

## 2020-06-19 DIAGNOSIS — K219 Gastro-esophageal reflux disease without esophagitis: Secondary | ICD-10-CM | POA: Diagnosis not present

## 2020-06-20 DIAGNOSIS — Z23 Encounter for immunization: Secondary | ICD-10-CM | POA: Diagnosis not present

## 2020-07-05 DIAGNOSIS — M7662 Achilles tendinitis, left leg: Secondary | ICD-10-CM | POA: Diagnosis not present

## 2020-07-05 DIAGNOSIS — M79672 Pain in left foot: Secondary | ICD-10-CM | POA: Diagnosis not present

## 2020-07-11 DIAGNOSIS — M79672 Pain in left foot: Secondary | ICD-10-CM | POA: Diagnosis not present

## 2020-07-11 DIAGNOSIS — M7662 Achilles tendinitis, left leg: Secondary | ICD-10-CM | POA: Diagnosis not present

## 2020-07-12 DIAGNOSIS — M7662 Achilles tendinitis, left leg: Secondary | ICD-10-CM | POA: Diagnosis not present

## 2020-07-12 DIAGNOSIS — M79672 Pain in left foot: Secondary | ICD-10-CM | POA: Diagnosis not present

## 2020-07-16 DIAGNOSIS — M7662 Achilles tendinitis, left leg: Secondary | ICD-10-CM | POA: Diagnosis not present

## 2020-07-16 DIAGNOSIS — M79672 Pain in left foot: Secondary | ICD-10-CM | POA: Diagnosis not present

## 2020-07-17 DIAGNOSIS — H40012 Open angle with borderline findings, low risk, left eye: Secondary | ICD-10-CM | POA: Diagnosis not present

## 2020-07-18 DIAGNOSIS — M79672 Pain in left foot: Secondary | ICD-10-CM | POA: Diagnosis not present

## 2020-07-18 DIAGNOSIS — M7662 Achilles tendinitis, left leg: Secondary | ICD-10-CM | POA: Diagnosis not present

## 2020-07-23 DIAGNOSIS — M7662 Achilles tendinitis, left leg: Secondary | ICD-10-CM | POA: Diagnosis not present

## 2020-07-23 DIAGNOSIS — M79672 Pain in left foot: Secondary | ICD-10-CM | POA: Diagnosis not present

## 2020-07-25 DIAGNOSIS — M7662 Achilles tendinitis, left leg: Secondary | ICD-10-CM | POA: Diagnosis not present

## 2020-07-25 DIAGNOSIS — M79672 Pain in left foot: Secondary | ICD-10-CM | POA: Diagnosis not present

## 2020-07-26 DIAGNOSIS — Z0100 Encounter for examination of eyes and vision without abnormal findings: Secondary | ICD-10-CM | POA: Diagnosis not present

## 2020-07-30 DIAGNOSIS — M7662 Achilles tendinitis, left leg: Secondary | ICD-10-CM | POA: Diagnosis not present

## 2020-07-30 DIAGNOSIS — M79672 Pain in left foot: Secondary | ICD-10-CM | POA: Diagnosis not present

## 2020-08-08 DIAGNOSIS — M79672 Pain in left foot: Secondary | ICD-10-CM | POA: Diagnosis not present

## 2020-08-08 DIAGNOSIS — M7662 Achilles tendinitis, left leg: Secondary | ICD-10-CM | POA: Diagnosis not present

## 2020-08-13 DIAGNOSIS — M79672 Pain in left foot: Secondary | ICD-10-CM | POA: Diagnosis not present

## 2020-08-13 DIAGNOSIS — M7662 Achilles tendinitis, left leg: Secondary | ICD-10-CM | POA: Diagnosis not present

## 2020-08-16 DIAGNOSIS — M79672 Pain in left foot: Secondary | ICD-10-CM | POA: Diagnosis not present

## 2020-08-16 DIAGNOSIS — M7662 Achilles tendinitis, left leg: Secondary | ICD-10-CM | POA: Diagnosis not present

## 2020-08-19 DIAGNOSIS — M7662 Achilles tendinitis, left leg: Secondary | ICD-10-CM | POA: Diagnosis not present

## 2020-08-19 DIAGNOSIS — M79672 Pain in left foot: Secondary | ICD-10-CM | POA: Diagnosis not present

## 2020-08-21 DIAGNOSIS — M79672 Pain in left foot: Secondary | ICD-10-CM | POA: Diagnosis not present

## 2020-08-21 DIAGNOSIS — M7662 Achilles tendinitis, left leg: Secondary | ICD-10-CM | POA: Diagnosis not present

## 2020-08-27 DIAGNOSIS — M79672 Pain in left foot: Secondary | ICD-10-CM | POA: Diagnosis not present

## 2020-08-27 DIAGNOSIS — M7662 Achilles tendinitis, left leg: Secondary | ICD-10-CM | POA: Diagnosis not present

## 2020-10-09 DIAGNOSIS — Z85820 Personal history of malignant melanoma of skin: Secondary | ICD-10-CM | POA: Diagnosis not present

## 2020-10-09 DIAGNOSIS — L570 Actinic keratosis: Secondary | ICD-10-CM | POA: Diagnosis not present

## 2020-10-09 DIAGNOSIS — L578 Other skin changes due to chronic exposure to nonionizing radiation: Secondary | ICD-10-CM | POA: Diagnosis not present

## 2020-10-09 DIAGNOSIS — Z872 Personal history of diseases of the skin and subcutaneous tissue: Secondary | ICD-10-CM | POA: Diagnosis not present

## 2020-10-09 DIAGNOSIS — B009 Herpesviral infection, unspecified: Secondary | ICD-10-CM | POA: Diagnosis not present

## 2020-10-25 ENCOUNTER — Inpatient Hospital Stay: Admission: RE | Admit: 2020-10-25 | Payer: Medicare HMO | Source: Ambulatory Visit

## 2020-11-04 DIAGNOSIS — M25561 Pain in right knee: Secondary | ICD-10-CM | POA: Diagnosis not present

## 2020-11-06 DIAGNOSIS — M170 Bilateral primary osteoarthritis of knee: Secondary | ICD-10-CM | POA: Diagnosis not present

## 2020-11-06 DIAGNOSIS — M25561 Pain in right knee: Secondary | ICD-10-CM | POA: Diagnosis not present

## 2020-11-07 DIAGNOSIS — M1990 Unspecified osteoarthritis, unspecified site: Secondary | ICD-10-CM | POA: Diagnosis not present

## 2020-11-07 DIAGNOSIS — N951 Menopausal and female climacteric states: Secondary | ICD-10-CM | POA: Diagnosis not present

## 2020-11-07 DIAGNOSIS — Z1211 Encounter for screening for malignant neoplasm of colon: Secondary | ICD-10-CM | POA: Diagnosis not present

## 2020-11-07 DIAGNOSIS — R61 Generalized hyperhidrosis: Secondary | ICD-10-CM | POA: Diagnosis not present

## 2020-11-07 DIAGNOSIS — M25562 Pain in left knee: Secondary | ICD-10-CM | POA: Diagnosis not present

## 2020-11-07 DIAGNOSIS — Z4689 Encounter for fitting and adjustment of other specified devices: Secondary | ICD-10-CM | POA: Diagnosis not present

## 2020-11-07 DIAGNOSIS — Z124 Encounter for screening for malignant neoplasm of cervix: Secondary | ICD-10-CM | POA: Diagnosis not present

## 2020-11-07 DIAGNOSIS — M25561 Pain in right knee: Secondary | ICD-10-CM | POA: Diagnosis not present

## 2020-11-11 DIAGNOSIS — M25561 Pain in right knee: Secondary | ICD-10-CM | POA: Diagnosis not present

## 2020-11-11 DIAGNOSIS — M25562 Pain in left knee: Secondary | ICD-10-CM | POA: Diagnosis not present

## 2020-11-12 ENCOUNTER — Other Ambulatory Visit: Payer: Self-pay | Admitting: Internal Medicine

## 2020-11-12 DIAGNOSIS — Z1231 Encounter for screening mammogram for malignant neoplasm of breast: Secondary | ICD-10-CM

## 2020-11-13 DIAGNOSIS — M25562 Pain in left knee: Secondary | ICD-10-CM | POA: Diagnosis not present

## 2020-11-13 DIAGNOSIS — M25561 Pain in right knee: Secondary | ICD-10-CM | POA: Diagnosis not present

## 2020-11-14 DIAGNOSIS — N8111 Cystocele, midline: Secondary | ICD-10-CM | POA: Diagnosis not present

## 2020-11-14 DIAGNOSIS — M170 Bilateral primary osteoarthritis of knee: Secondary | ICD-10-CM | POA: Diagnosis not present

## 2020-11-14 DIAGNOSIS — M7662 Achilles tendinitis, left leg: Secondary | ICD-10-CM | POA: Diagnosis not present

## 2020-11-14 DIAGNOSIS — K219 Gastro-esophageal reflux disease without esophagitis: Secondary | ICD-10-CM | POA: Diagnosis not present

## 2020-11-14 DIAGNOSIS — N814 Uterovaginal prolapse, unspecified: Secondary | ICD-10-CM | POA: Diagnosis not present

## 2020-11-14 DIAGNOSIS — E559 Vitamin D deficiency, unspecified: Secondary | ICD-10-CM | POA: Diagnosis not present

## 2020-11-14 DIAGNOSIS — Z0000 Encounter for general adult medical examination without abnormal findings: Secondary | ICD-10-CM | POA: Diagnosis not present

## 2020-11-14 DIAGNOSIS — E782 Mixed hyperlipidemia: Secondary | ICD-10-CM | POA: Diagnosis not present

## 2020-11-14 DIAGNOSIS — J301 Allergic rhinitis due to pollen: Secondary | ICD-10-CM | POA: Diagnosis not present

## 2020-11-18 DIAGNOSIS — M25562 Pain in left knee: Secondary | ICD-10-CM | POA: Diagnosis not present

## 2020-11-18 DIAGNOSIS — M25561 Pain in right knee: Secondary | ICD-10-CM | POA: Diagnosis not present

## 2020-11-21 DIAGNOSIS — E559 Vitamin D deficiency, unspecified: Secondary | ICD-10-CM | POA: Diagnosis not present

## 2020-11-21 DIAGNOSIS — Z79899 Other long term (current) drug therapy: Secondary | ICD-10-CM | POA: Diagnosis not present

## 2020-11-21 DIAGNOSIS — E782 Mixed hyperlipidemia: Secondary | ICD-10-CM | POA: Diagnosis not present

## 2020-11-21 DIAGNOSIS — M25561 Pain in right knee: Secondary | ICD-10-CM | POA: Diagnosis not present

## 2020-11-21 DIAGNOSIS — M25562 Pain in left knee: Secondary | ICD-10-CM | POA: Diagnosis not present

## 2020-11-21 DIAGNOSIS — R7302 Impaired glucose tolerance (oral): Secondary | ICD-10-CM | POA: Diagnosis not present

## 2020-11-27 DIAGNOSIS — M25562 Pain in left knee: Secondary | ICD-10-CM | POA: Diagnosis not present

## 2020-11-27 DIAGNOSIS — M25561 Pain in right knee: Secondary | ICD-10-CM | POA: Diagnosis not present

## 2020-11-29 DIAGNOSIS — M25562 Pain in left knee: Secondary | ICD-10-CM | POA: Diagnosis not present

## 2020-11-29 DIAGNOSIS — M25561 Pain in right knee: Secondary | ICD-10-CM | POA: Diagnosis not present

## 2020-12-02 DIAGNOSIS — M25562 Pain in left knee: Secondary | ICD-10-CM | POA: Diagnosis not present

## 2020-12-02 DIAGNOSIS — M25561 Pain in right knee: Secondary | ICD-10-CM | POA: Diagnosis not present

## 2020-12-04 DIAGNOSIS — M25562 Pain in left knee: Secondary | ICD-10-CM | POA: Diagnosis not present

## 2020-12-04 DIAGNOSIS — M25561 Pain in right knee: Secondary | ICD-10-CM | POA: Diagnosis not present

## 2020-12-06 DIAGNOSIS — M25562 Pain in left knee: Secondary | ICD-10-CM | POA: Diagnosis not present

## 2020-12-06 DIAGNOSIS — M25561 Pain in right knee: Secondary | ICD-10-CM | POA: Diagnosis not present

## 2020-12-13 DIAGNOSIS — M25561 Pain in right knee: Secondary | ICD-10-CM | POA: Diagnosis not present

## 2020-12-13 DIAGNOSIS — M25562 Pain in left knee: Secondary | ICD-10-CM | POA: Diagnosis not present

## 2020-12-16 DIAGNOSIS — M25562 Pain in left knee: Secondary | ICD-10-CM | POA: Diagnosis not present

## 2020-12-16 DIAGNOSIS — M25561 Pain in right knee: Secondary | ICD-10-CM | POA: Diagnosis not present

## 2020-12-17 ENCOUNTER — Other Ambulatory Visit: Payer: Self-pay

## 2020-12-17 ENCOUNTER — Ambulatory Visit
Admission: RE | Admit: 2020-12-17 | Discharge: 2020-12-17 | Disposition: A | Discharge status: Home / Self Care | Payer: Medicare HMO | Source: Ambulatory Visit | Attending: Internal Medicine | Admitting: Internal Medicine

## 2020-12-17 DIAGNOSIS — Z1231 Encounter for screening mammogram for malignant neoplasm of breast: Secondary | ICD-10-CM | POA: Insufficient documentation

## 2020-12-18 DIAGNOSIS — M25562 Pain in left knee: Secondary | ICD-10-CM | POA: Diagnosis not present

## 2020-12-18 DIAGNOSIS — M25561 Pain in right knee: Secondary | ICD-10-CM | POA: Diagnosis not present

## 2020-12-20 DIAGNOSIS — M25562 Pain in left knee: Secondary | ICD-10-CM | POA: Diagnosis not present

## 2020-12-20 DIAGNOSIS — M25561 Pain in right knee: Secondary | ICD-10-CM | POA: Diagnosis not present

## 2020-12-25 DIAGNOSIS — M25561 Pain in right knee: Secondary | ICD-10-CM | POA: Diagnosis not present

## 2020-12-25 DIAGNOSIS — M25562 Pain in left knee: Secondary | ICD-10-CM | POA: Diagnosis not present

## 2020-12-26 ENCOUNTER — Other Ambulatory Visit: Payer: Self-pay

## 2020-12-26 ENCOUNTER — Other Ambulatory Visit
Admission: RE | Admit: 2020-12-26 | Discharge: 2020-12-26 | Disposition: A | Discharge status: Home / Self Care | Payer: Medicare HMO | Source: Ambulatory Visit | Attending: Gastroenterology | Admitting: Gastroenterology

## 2020-12-26 DIAGNOSIS — Z01812 Encounter for preprocedural laboratory examination: Secondary | ICD-10-CM | POA: Insufficient documentation

## 2020-12-26 DIAGNOSIS — Z20822 Contact with and (suspected) exposure to covid-19: Secondary | ICD-10-CM | POA: Insufficient documentation

## 2020-12-26 LAB — SARS CORONAVIRUS 2 (TAT 6-24 HRS): SARS Coronavirus 2: NEGATIVE

## 2020-12-27 ENCOUNTER — Encounter: Payer: Self-pay | Admitting: *Deleted

## 2020-12-30 ENCOUNTER — Ambulatory Visit
Admission: RE | Admit: 2020-12-30 | Discharge: 2020-12-30 | Disposition: A | Discharge status: Home / Self Care | Payer: Medicare HMO | Attending: Gastroenterology | Admitting: Gastroenterology

## 2020-12-30 ENCOUNTER — Encounter
Admission: RE | Disposition: A | Discharge status: Home / Self Care | Payer: Self-pay | Source: Home / Self Care | Attending: Gastroenterology

## 2020-12-30 ENCOUNTER — Other Ambulatory Visit: Payer: Self-pay

## 2020-12-30 ENCOUNTER — Encounter: Payer: Self-pay | Admitting: *Deleted

## 2020-12-30 ENCOUNTER — Ambulatory Visit: Payer: Medicare HMO | Admitting: Registered Nurse

## 2020-12-30 DIAGNOSIS — Z1211 Encounter for screening for malignant neoplasm of colon: Secondary | ICD-10-CM | POA: Diagnosis not present

## 2020-12-30 DIAGNOSIS — E785 Hyperlipidemia, unspecified: Secondary | ICD-10-CM | POA: Insufficient documentation

## 2020-12-30 DIAGNOSIS — K5730 Diverticulosis of large intestine without perforation or abscess without bleeding: Secondary | ICD-10-CM | POA: Diagnosis not present

## 2020-12-30 DIAGNOSIS — Z886 Allergy status to analgesic agent status: Secondary | ICD-10-CM | POA: Diagnosis not present

## 2020-12-30 DIAGNOSIS — D120 Benign neoplasm of cecum: Secondary | ICD-10-CM | POA: Diagnosis not present

## 2020-12-30 DIAGNOSIS — Z86010 Personal history of colonic polyps: Secondary | ICD-10-CM | POA: Diagnosis not present

## 2020-12-30 DIAGNOSIS — D125 Benign neoplasm of sigmoid colon: Secondary | ICD-10-CM | POA: Diagnosis not present

## 2020-12-30 DIAGNOSIS — Z800 Family history of malignant neoplasm of digestive organs: Secondary | ICD-10-CM | POA: Insufficient documentation

## 2020-12-30 DIAGNOSIS — Z79899 Other long term (current) drug therapy: Secondary | ICD-10-CM | POA: Insufficient documentation

## 2020-12-30 DIAGNOSIS — Z885 Allergy status to narcotic agent status: Secondary | ICD-10-CM | POA: Insufficient documentation

## 2020-12-30 DIAGNOSIS — K5790 Diverticulosis of intestine, part unspecified, without perforation or abscess without bleeding: Secondary | ICD-10-CM | POA: Diagnosis not present

## 2020-12-30 DIAGNOSIS — K635 Polyp of colon: Secondary | ICD-10-CM | POA: Diagnosis not present

## 2020-12-30 DIAGNOSIS — Z85820 Personal history of malignant melanoma of skin: Secondary | ICD-10-CM | POA: Diagnosis not present

## 2020-12-30 DIAGNOSIS — E7800 Pure hypercholesterolemia, unspecified: Secondary | ICD-10-CM | POA: Insufficient documentation

## 2020-12-30 DIAGNOSIS — K640 First degree hemorrhoids: Secondary | ICD-10-CM | POA: Insufficient documentation

## 2020-12-30 HISTORY — DX: Allergic rhinitis, unspecified: J30.9

## 2020-12-30 HISTORY — DX: Impaired glucose tolerance (oral): R73.02

## 2020-12-30 HISTORY — DX: Uterovaginal prolapse, unspecified: N81.4

## 2020-12-30 HISTORY — DX: Vitamin D deficiency, unspecified: E55.9

## 2020-12-30 HISTORY — DX: Gastro-esophageal reflux disease without esophagitis: K21.9

## 2020-12-30 HISTORY — DX: Melanoma in situ of other part of trunk: D03.59

## 2020-12-30 HISTORY — DX: Hyperlipidemia, unspecified: E78.5

## 2020-12-30 HISTORY — PX: COLONOSCOPY: SHX5424

## 2020-12-30 HISTORY — DX: Urticaria, unspecified: L50.9

## 2020-12-30 HISTORY — DX: Other specified disorders of bone density and structure, unspecified site: M85.80

## 2020-12-30 SURGERY — COLONOSCOPY
Anesthesia: General

## 2020-12-30 MED ORDER — LIDOCAINE HCL (CARDIAC) PF 100 MG/5ML IV SOSY
PREFILLED_SYRINGE | INTRAVENOUS | Status: DC | PRN
  Administered 2020-12-30: 100 mg via INTRAVENOUS

## 2020-12-30 MED ORDER — PHENYLEPHRINE HCL (PRESSORS) 10 MG/ML IV SOLN
INTRAVENOUS | Status: DC | PRN
  Administered 2020-12-30: 100 ug via INTRAVENOUS
  Administered 2020-12-30: 200 ug via INTRAVENOUS

## 2020-12-30 MED ORDER — SODIUM CHLORIDE 0.9 % IV SOLN: INTRAVENOUS | Status: DC

## 2020-12-30 MED ORDER — PROPOFOL 500 MG/50ML IV EMUL
INTRAVENOUS | Status: DC | PRN
  Administered 2020-12-30: 150 ug/kg/min via INTRAVENOUS

## 2020-12-30 MED ORDER — ONDANSETRON HCL 4 MG/2ML IJ SOLN
INTRAMUSCULAR | Status: DC | PRN
  Administered 2020-12-30: 4 mg via INTRAVENOUS

## 2020-12-30 MED ORDER — PROPOFOL 10 MG/ML IV BOLUS
INTRAVENOUS | Status: DC | PRN
  Administered 2020-12-30: 70 mg via INTRAVENOUS

## 2020-12-30 MED ORDER — ONDANSETRON HCL 4 MG/2ML IJ SOLN
INTRAMUSCULAR | Status: AC
  Filled 2020-12-30: qty 2

## 2020-12-30 NOTE — Interval H&P Note (Signed)
History and Physical Interval Note:  12/30/2020 9:07 AM  Tracy Rodgers  has presented today for surgery, with the diagnosis of history adenomatous polyps of the colon.  The various methods of treatment have been discussed with the patient and family. After consideration of risks, benefits and other options for treatment, the patient has consented to  Procedure(s) with comments: COLONOSCOPY (N/A) - Patient says she needs Scolpamine patch after anesthesia for nausea. as a surgical intervention.  The patient's history has been reviewed, patient examined, no change in status, stable for surgery.  I have reviewed the patient's chart and labs.  Questions were answered to the patient's satisfaction.     Lesly Rubenstein  Ok to proceed with colonoscopy

## 2020-12-30 NOTE — Transfer of Care (Signed)
Immediate Anesthesia Transfer of Care Note  Patient: Tracy Rodgers  Procedure(s) Performed: COLONOSCOPY (N/A )  Patient Location: Endoscopy Unit  Anesthesia Type:General  Level of Consciousness: drowsy  Airway & Oxygen Therapy: Patient Spontanous Breathing  Post-op Assessment: Report given to RN and Post -op Vital signs reviewed and stable  Post vital signs: Reviewed and stable  Last Vitals:  Vitals Value Taken Time  BP 137/75 12/30/20 0937  Temp 36.1 C 12/30/20 0937  Pulse 74 12/30/20 0938  Resp 20 12/30/20 0938  SpO2 97 % 12/30/20 0938  Vitals shown include unvalidated device data.  Last Pain:  Vitals:   12/30/20 0937  TempSrc: Temporal  PainSc:          Complications: No complications documented.

## 2020-12-30 NOTE — H&P (Signed)
Outpatient short stay form Pre-procedure 12/30/2020 9:05 AM Tracy Miyamoto MD, MPH  Primary Physician: Dr. Raechel Ache  Reason for visit:  Surveillance colonoscopy  History of present illness:   75 y/o lady with history of colon cancer in her sister in her 55's and history of polyps here for surveillance colonoscopy. No blood thinners. History of appendectomy. No significant new symptoms.    Current Facility-Administered Medications:  .  0.9 %  sodium chloride infusion, , Intravenous, Continuous, Kiasha Bellin, Hilton Cork, MD, Last Rate: 20 mL/hr at 12/30/20 0859, New Bag at 12/30/20 0859  Medications Prior to Admission  Medication Sig Dispense Refill Last Dose  . acetaminophen (TYLENOL) 500 MG tablet Take 500 mg by mouth every 6 (six) hours as needed.   12/29/2020 at Unknown time  . atorvastatin (LIPITOR) 10 MG tablet Take 10 mg by mouth daily.   12/29/2020 at 2300  . calcium citrate-vitamin D (CITRACAL+D) 315-200 MG-UNIT tablet Take 1 tablet by mouth 2 (two) times daily.   Past Week at Unknown time  . Cholecalciferol (VITAMIN D3) 125 MCG (5000 UT) CAPS Take by mouth.   Past Week at Unknown time  . EPINEPHrine 0.3 mg/0.3 mL IJ SOAJ injection Inject 0.3 mg into the muscle as needed for anaphylaxis.   Past Month at Unknown time  . Multiple Vitamin (MULTIVITAMIN WITH MINERALS) TABS tablet Take 1 tablet by mouth daily.   Past Week at Unknown time  . omeprazole (PRILOSEC) 20 MG capsule Take 20 mg by mouth daily.   12/27/2020  . conjugated estrogens (PREMARIN) vaginal cream Place vaginally daily. 42.5 g 2 12/25/2020  . Oxyquinolone Sulfate (TRIMO-SAN VA) Place vaginally.   12/22/2020  . scopolamine (TRANSDERM-SCOP, 1.5 MG,) 1 MG/3DAYS PLACE 1 PATCH (1.5 MG TOTAL) ONTO THE SKIN EVERY THIRD DAY. (Patient not taking: Reported on 12/30/2020) 10 patch 0 Completed Course at Unknown time     Allergies  Allergen Reactions  . Nsaids Rash  . Oxycodone-Acetaminophen Nausea Only and Nausea And Vomiting  . Aspirin    . Ibuprofen Hives  . Other Hives    Black Pepper     Past Medical History:  Diagnosis Date  . Achilles tendinitis   . Allergic rhinitis   . Arthritis   . Cystocele, unspecified (CODE)   . Diverticulosis   . Dyspareunia, female   . Elevated cholesterol   . GERD (gastroesophageal reflux disease)   . History of heavy periods   . HLD (hyperlipidemia)   . Impaired glucose tolerance   . Melanoma ([place])    on her back  . Melanoma in situ of back ([place])   . Osteopenia   . Painful menstrual periods   . Rectocele   . Urticaria   . Uterine prolapse   . Vitamin D deficiency     Review of systems:  Otherwise negative.    Physical Exam  Gen: Alert, oriented. Appears stated age.  HEENT: PERRLA. Lungs: No respiratory distress CV: RRR Abd: soft, benign, no masses Ext: No edema    Planned procedures: Proceed with colonoscopy. The patient understands the nature of the planned procedure, indications, risks, alternatives and potential complications including but not limited to bleeding, infection, perforation, damage to internal organs and possible oversedation/side effects from anesthesia. The patient agrees and gives consent to proceed.  Please refer to procedure notes for findings, recommendations and patient disposition/instructions.     Tracy Miyamoto MD, MPH Gastroenterology 12/30/2020  9:05 AM

## 2020-12-30 NOTE — Op Note (Signed)
[facility] Gastroenterology Patient Name: Monserratt Knezevic Procedure Date: 12/30/2020 9:07 AM MRN: [id number] Account #: [id number] Date of Birth: 1946/07/03 Admit Type: Outpatient Age: 75 Room: [facility] ENDO ROOM 3 Gender: Female Note Status: Finalized Procedure:             Colonoscopy Indications:           High risk colon cancer surveillance: Personal history                         of colonic polyps, Family history of colon cancer in a                         first-degree relative before age 37 years Providers:             Andrey Farmer MD, MD Medicines:             Monitored Anesthesia Care Complications:         No immediate complications. Estimated blood loss:                         Minimal. Procedure:             Pre-Anesthesia Assessment:                        - Prior to the procedure, a History and Physical was                         performed, and patient medications and allergies were                         reviewed. The patient is competent. The risks and                         benefits of the procedure and the sedation options and                         risks were discussed with the patient. All questions                         were answered and informed consent was obtained.                         Patient identification and proposed procedure were                         verified by the physician, the nurse, the anesthetist                         and the technician in the endoscopy suite. Mental                         Status Examination: alert and oriented. Respiratory                         Examination: clear to auscultation. CV Examination:                         normal. Prophylactic Antibiotics: The patient does not  require prophylactic antibiotics. Prior                         Anticoagulants: The patient has taken no previous                         anticoagulant or antiplatelet agents. ASA Grade                          Assessment: II - A patient with mild systemic disease.                         After reviewing the risks and benefits, the patient                         was deemed in satisfactory condition to undergo the                         procedure. The anesthesia plan was to use monitored                         anesthesia care (MAC). Immediately prior to                         administration of medications, the patient was                         re-assessed for adequacy to receive sedatives. The                         heart rate, respiratory rate, oxygen saturations,                         blood pressure, adequacy of pulmonary ventilation, and                         response to care were monitored throughout the                         procedure. The physical status of the patient was                         re-assessed after the procedure.                        After obtaining informed consent, the colonoscope was                         passed under direct vision. Throughout the procedure,                         the patient's blood pressure, pulse, and oxygen                         saturations were monitored continuously. The                         Colonoscope was introduced through the anus and  advanced to the the cecum, identified by appendiceal                         orifice and ileocecal valve. The colonoscopy was                         performed without difficulty. The patient tolerated                         the procedure well. The quality of the bowel                         preparation was good. Findings:      The perianal and digital rectal examinations were normal.      A 3 mm polyp was found in the cecum. The polyp was sessile. The polyp       was removed with a cold snare. Resection and retrieval were complete.       Estimated blood loss was minimal.      A 2 mm polyp was found in the sigmoid colon. The polyp was sessile. The       polyp  was removed with a cold snare. Resection and retrieval were       complete. Estimated blood loss was minimal.      Many small and large-mouthed diverticula were found in the sigmoid colon.      Internal hemorrhoids were found during retroflexion. The hemorrhoids       were Grade I (internal hemorrhoids that do not prolapse).      The exam was otherwise without abnormality on direct and retroflexion       views. Impression:            - One 3 mm polyp in the cecum, removed with a cold                         snare. Resected and retrieved.                        - One 2 mm polyp in the sigmoid colon, removed with a                         cold snare. Resected and retrieved.                        - Diverticulosis in the sigmoid colon.                        - Internal hemorrhoids.                        - The examination was otherwise normal on direct and                         retroflexion views. Recommendation:        - Discharge patient to home.                        - Resume previous diet.                        - Continue  present medications.                        - Await pathology results.                        - Repeat colonoscopy in 5 years for surveillance.                        - Return to referring physician as previously                         scheduled. Procedure Code(s):     --- Professional ---                        [phone], Colonoscopy, flexible; with removal of                         tumor(s), polyp(s), or other lesion(s) by snare                         technique Diagnosis Code(s):     --- Professional ---                        Z86.010, Personal history of colonic polyps                        K63.5, Polyp of colon                        K64.0, First degree hemorrhoids                        Z80.0, Family history of malignant neoplasm of                         digestive organs                        K57.30, Diverticulosis of large intestine without                          perforation or abscess without bleeding CPT copyright 2019 [employer]. All rights reserved. The codes documented in this report are preliminary and upon coder review may  be revised to meet current compliance requirements. Andrey Farmer MD, MD 12/30/2020 9:37:47 AM Number of Addenda: 0 Note Initiated On: 12/30/2020 9:07 AM Scope Withdrawal Time: 0 hours 11 minutes 57 seconds  Total Procedure Duration: 0 hours 19 minutes 12 seconds  Estimated Blood Loss:  Estimated blood loss was minimal.      [facility]

## 2020-12-30 NOTE — Anesthesia Preprocedure Evaluation (Signed)
Anesthesia Evaluation  Patient identified by MRN, date of birth, ID band Patient awake    Reviewed: Allergy & Precautions, NPO status , Patient's Chart, lab work & pertinent test results  History of Anesthesia Complications (+) PONV and history of anesthetic complications  Airway Mallampati: III       Dental   Pulmonary neg sleep apnea, neg COPD, Not current smoker,           Cardiovascular (-) hypertension(-) Past MI and (-) CHF (-) dysrhythmias (-) Valvular Problems/Murmurs     Neuro/Psych neg Seizures    GI/Hepatic Neg liver ROS, GERD  Medicated and Controlled,  Endo/Other  neg diabetes  Renal/GU negative Renal ROS     Musculoskeletal   Abdominal   Peds  Hematology   Anesthesia Other Findings   Reproductive/Obstetrics                            Anesthesia Physical Anesthesia Plan  ASA: II  Anesthesia Plan: General   Post-op Pain Management:    Induction: Intravenous  PONV Risk Score and Plan: 4 or greater and Propofol infusion, TIVA and Treatment may vary due to age or medical condition  Airway Management Planned: Nasal Cannula  Additional Equipment:   Intra-op Plan:   Post-operative Plan:   Informed Consent: I have reviewed the patients History and Physical, chart, labs and discussed the procedure including the risks, benefits and alternatives for the proposed anesthesia with the patient or authorized representative who has indicated his/her understanding and acceptance.       Plan Discussed with:   Anesthesia Plan Comments:         Anesthesia Quick Evaluation

## 2020-12-30 NOTE — Anesthesia Postprocedure Evaluation (Signed)
Anesthesia Post Note  Patient: Tracy Rodgers  Procedure(s) Performed: COLONOSCOPY (N/A )  Patient location during evaluation: Endoscopy Anesthesia Type: General Level of consciousness: awake and alert Pain management: pain level controlled Vital Signs Assessment: post-procedure vital signs reviewed and stable Respiratory status: spontaneous breathing and respiratory function stable Cardiovascular status: stable Anesthetic complications: no   No complications documented.   Last Vitals:  Vitals:   12/30/20 0937 12/30/20 0947  BP: 137/75 137/80  Pulse: 73 68  Resp: 19 13  Temp: (!) 36.1 C   SpO2: 97% 100%    Last Pain:  Vitals:   12/30/20 0947  TempSrc:   PainSc: 0-No pain                 Allyna Pittsley K

## 2020-12-31 ENCOUNTER — Encounter: Payer: Self-pay | Admitting: Gastroenterology

## 2020-12-31 LAB — SURGICAL PATHOLOGY

## 2021-01-01 DIAGNOSIS — M7662 Achilles tendinitis, left leg: Secondary | ICD-10-CM | POA: Diagnosis not present

## 2021-01-01 DIAGNOSIS — M25572 Pain in left ankle and joints of left foot: Secondary | ICD-10-CM | POA: Diagnosis not present

## 2021-01-02 DIAGNOSIS — M25561 Pain in right knee: Secondary | ICD-10-CM | POA: Diagnosis not present

## 2021-01-02 DIAGNOSIS — M25562 Pain in left knee: Secondary | ICD-10-CM | POA: Diagnosis not present

## 2021-01-27 DIAGNOSIS — M9262 Juvenile osteochondrosis of tarsus, left ankle: Secondary | ICD-10-CM | POA: Diagnosis not present

## 2021-01-27 DIAGNOSIS — M7732 Calcaneal spur, left foot: Secondary | ICD-10-CM | POA: Diagnosis not present

## 2021-01-27 DIAGNOSIS — M216X2 Other acquired deformities of left foot: Secondary | ICD-10-CM | POA: Diagnosis not present

## 2021-01-27 DIAGNOSIS — M79672 Pain in left foot: Secondary | ICD-10-CM | POA: Diagnosis not present

## 2021-01-27 DIAGNOSIS — M216X1 Other acquired deformities of right foot: Secondary | ICD-10-CM | POA: Diagnosis not present

## 2021-01-27 DIAGNOSIS — M7662 Achilles tendinitis, left leg: Secondary | ICD-10-CM | POA: Diagnosis not present

## 2021-01-27 DIAGNOSIS — L603 Nail dystrophy: Secondary | ICD-10-CM | POA: Diagnosis not present

## 2021-01-30 DIAGNOSIS — Z0100 Encounter for examination of eyes and vision without abnormal findings: Secondary | ICD-10-CM | POA: Diagnosis not present

## 2021-02-13 DIAGNOSIS — Z20822 Contact with and (suspected) exposure to covid-19: Secondary | ICD-10-CM | POA: Diagnosis not present

## 2021-02-25 DIAGNOSIS — M5442 Lumbago with sciatica, left side: Secondary | ICD-10-CM | POA: Diagnosis not present

## 2021-02-25 DIAGNOSIS — M48061 Spinal stenosis, lumbar region without neurogenic claudication: Secondary | ICD-10-CM | POA: Diagnosis not present

## 2021-02-25 DIAGNOSIS — M5441 Lumbago with sciatica, right side: Secondary | ICD-10-CM | POA: Diagnosis not present

## 2021-02-25 DIAGNOSIS — M5137 Other intervertebral disc degeneration, lumbosacral region: Secondary | ICD-10-CM | POA: Diagnosis not present

## 2021-02-25 DIAGNOSIS — G8929 Other chronic pain: Secondary | ICD-10-CM | POA: Diagnosis not present

## 2021-03-17 DIAGNOSIS — M25562 Pain in left knee: Secondary | ICD-10-CM | POA: Diagnosis not present

## 2021-03-17 DIAGNOSIS — M25561 Pain in right knee: Secondary | ICD-10-CM | POA: Diagnosis not present

## 2021-03-17 DIAGNOSIS — M5451 Vertebrogenic low back pain: Secondary | ICD-10-CM | POA: Diagnosis not present

## 2021-03-17 DIAGNOSIS — R262 Difficulty in walking, not elsewhere classified: Secondary | ICD-10-CM | POA: Diagnosis not present

## 2021-03-17 DIAGNOSIS — M25572 Pain in left ankle and joints of left foot: Secondary | ICD-10-CM | POA: Diagnosis not present

## 2021-03-19 DIAGNOSIS — M25572 Pain in left ankle and joints of left foot: Secondary | ICD-10-CM | POA: Diagnosis not present

## 2021-03-19 DIAGNOSIS — R262 Difficulty in walking, not elsewhere classified: Secondary | ICD-10-CM | POA: Diagnosis not present

## 2021-03-19 DIAGNOSIS — M25562 Pain in left knee: Secondary | ICD-10-CM | POA: Diagnosis not present

## 2021-03-19 DIAGNOSIS — M25561 Pain in right knee: Secondary | ICD-10-CM | POA: Diagnosis not present

## 2021-03-19 DIAGNOSIS — M5451 Vertebrogenic low back pain: Secondary | ICD-10-CM | POA: Diagnosis not present

## 2021-03-25 DIAGNOSIS — R262 Difficulty in walking, not elsewhere classified: Secondary | ICD-10-CM | POA: Diagnosis not present

## 2021-03-25 DIAGNOSIS — M25562 Pain in left knee: Secondary | ICD-10-CM | POA: Diagnosis not present

## 2021-03-25 DIAGNOSIS — M5451 Vertebrogenic low back pain: Secondary | ICD-10-CM | POA: Diagnosis not present

## 2021-03-25 DIAGNOSIS — M25572 Pain in left ankle and joints of left foot: Secondary | ICD-10-CM | POA: Diagnosis not present

## 2021-03-25 DIAGNOSIS — M25561 Pain in right knee: Secondary | ICD-10-CM | POA: Diagnosis not present

## 2021-03-26 DIAGNOSIS — M170 Bilateral primary osteoarthritis of knee: Secondary | ICD-10-CM | POA: Diagnosis not present

## 2021-03-26 DIAGNOSIS — M25511 Pain in right shoulder: Secondary | ICD-10-CM | POA: Diagnosis not present

## 2021-03-26 DIAGNOSIS — M5441 Lumbago with sciatica, right side: Secondary | ICD-10-CM | POA: Diagnosis not present

## 2021-03-26 DIAGNOSIS — G8929 Other chronic pain: Secondary | ICD-10-CM | POA: Diagnosis not present

## 2021-03-26 DIAGNOSIS — M5442 Lumbago with sciatica, left side: Secondary | ICD-10-CM | POA: Diagnosis not present

## 2021-03-26 DIAGNOSIS — M7551 Bursitis of right shoulder: Secondary | ICD-10-CM | POA: Diagnosis not present

## 2021-04-03 DIAGNOSIS — M1712 Unilateral primary osteoarthritis, left knee: Secondary | ICD-10-CM | POA: Diagnosis not present

## 2021-04-03 DIAGNOSIS — M1711 Unilateral primary osteoarthritis, right knee: Secondary | ICD-10-CM | POA: Diagnosis not present

## 2021-04-03 DIAGNOSIS — Z79899 Other long term (current) drug therapy: Secondary | ICD-10-CM | POA: Diagnosis not present

## 2021-04-03 DIAGNOSIS — M25511 Pain in right shoulder: Secondary | ICD-10-CM | POA: Diagnosis not present

## 2021-04-09 DIAGNOSIS — M25562 Pain in left knee: Secondary | ICD-10-CM | POA: Diagnosis not present

## 2021-04-09 DIAGNOSIS — R262 Difficulty in walking, not elsewhere classified: Secondary | ICD-10-CM | POA: Diagnosis not present

## 2021-04-09 DIAGNOSIS — M25561 Pain in right knee: Secondary | ICD-10-CM | POA: Diagnosis not present

## 2021-04-09 DIAGNOSIS — M25572 Pain in left ankle and joints of left foot: Secondary | ICD-10-CM | POA: Diagnosis not present

## 2021-04-09 DIAGNOSIS — M5451 Vertebrogenic low back pain: Secondary | ICD-10-CM | POA: Diagnosis not present

## 2021-04-14 ENCOUNTER — Other Ambulatory Visit
Admission: RE | Admit: 2021-04-14 | Discharge: 2021-04-14 | Disposition: A | Discharge status: Home / Self Care | Payer: Medicare HMO | Source: Ambulatory Visit | Attending: Sports Medicine | Admitting: Sports Medicine

## 2021-04-14 DIAGNOSIS — G8929 Other chronic pain: Secondary | ICD-10-CM | POA: Diagnosis not present

## 2021-04-14 DIAGNOSIS — M25511 Pain in right shoulder: Secondary | ICD-10-CM | POA: Insufficient documentation

## 2021-04-14 DIAGNOSIS — R7982 Elevated C-reactive protein (CRP): Secondary | ICD-10-CM | POA: Diagnosis not present

## 2021-04-14 DIAGNOSIS — M7551 Bursitis of right shoulder: Secondary | ICD-10-CM | POA: Diagnosis not present

## 2021-04-14 DIAGNOSIS — M545 Low back pain, unspecified: Secondary | ICD-10-CM | POA: Diagnosis not present

## 2021-04-14 DIAGNOSIS — M25411 Effusion, right shoulder: Secondary | ICD-10-CM | POA: Diagnosis not present

## 2021-04-16 DIAGNOSIS — Z85820 Personal history of malignant melanoma of skin: Secondary | ICD-10-CM | POA: Diagnosis not present

## 2021-04-16 DIAGNOSIS — L578 Other skin changes due to chronic exposure to nonionizing radiation: Secondary | ICD-10-CM | POA: Diagnosis not present

## 2021-04-16 DIAGNOSIS — Z872 Personal history of diseases of the skin and subcutaneous tissue: Secondary | ICD-10-CM | POA: Diagnosis not present

## 2021-04-16 DIAGNOSIS — L570 Actinic keratosis: Secondary | ICD-10-CM | POA: Diagnosis not present

## 2021-04-17 LAB — SYNOVIAL CELL COUNT + DIFF, W/ CRYSTALS
Crystals, Fluid: NONE SEEN
Eosinophils-Synovial: 0 %
Lymphocytes-Synovial Fld: 75 %
Monocyte-Macrophage-Synovial Fluid: 21 %
Neutrophil, Synovial: 4 %
Other Cells-SYN: 0
WBC, Synovial: 254 /mm3 — ABNORMAL HIGH (ref 0–200)

## 2021-04-25 ENCOUNTER — Other Ambulatory Visit: Payer: Self-pay | Admitting: Physical Medicine & Rehabilitation

## 2021-04-25 DIAGNOSIS — M5442 Lumbago with sciatica, left side: Secondary | ICD-10-CM

## 2021-04-25 DIAGNOSIS — G8929 Other chronic pain: Secondary | ICD-10-CM

## 2021-04-25 DIAGNOSIS — M5441 Lumbago with sciatica, right side: Secondary | ICD-10-CM | POA: Diagnosis not present

## 2021-05-06 ENCOUNTER — Other Ambulatory Visit: Payer: Self-pay

## 2021-05-06 ENCOUNTER — Ambulatory Visit
Admission: RE | Admit: 2021-05-06 | Discharge: 2021-05-06 | Disposition: A | Discharge status: Home / Self Care | Payer: Medicare HMO | Source: Ambulatory Visit | Attending: Physical Medicine & Rehabilitation | Admitting: Physical Medicine & Rehabilitation

## 2021-05-06 DIAGNOSIS — M5441 Lumbago with sciatica, right side: Secondary | ICD-10-CM | POA: Insufficient documentation

## 2021-05-06 DIAGNOSIS — M5442 Lumbago with sciatica, left side: Secondary | ICD-10-CM | POA: Diagnosis not present

## 2021-05-06 DIAGNOSIS — M545 Low back pain, unspecified: Secondary | ICD-10-CM | POA: Diagnosis not present

## 2021-05-06 DIAGNOSIS — G8929 Other chronic pain: Secondary | ICD-10-CM | POA: Diagnosis not present

## 2021-05-13 DIAGNOSIS — M7551 Bursitis of right shoulder: Secondary | ICD-10-CM | POA: Diagnosis not present

## 2021-05-13 DIAGNOSIS — M5136 Other intervertebral disc degeneration, lumbar region: Secondary | ICD-10-CM | POA: Diagnosis not present

## 2021-05-15 DIAGNOSIS — M48062 Spinal stenosis, lumbar region with neurogenic claudication: Secondary | ICD-10-CM | POA: Diagnosis not present

## 2021-05-15 DIAGNOSIS — M5442 Lumbago with sciatica, left side: Secondary | ICD-10-CM | POA: Diagnosis not present

## 2021-05-15 DIAGNOSIS — M5441 Lumbago with sciatica, right side: Secondary | ICD-10-CM | POA: Diagnosis not present

## 2021-05-15 DIAGNOSIS — G8929 Other chronic pain: Secondary | ICD-10-CM | POA: Diagnosis not present

## 2021-05-29 DIAGNOSIS — M6281 Muscle weakness (generalized): Secondary | ICD-10-CM | POA: Diagnosis not present

## 2021-05-29 DIAGNOSIS — M5441 Lumbago with sciatica, right side: Secondary | ICD-10-CM | POA: Diagnosis not present

## 2021-05-29 DIAGNOSIS — M48062 Spinal stenosis, lumbar region with neurogenic claudication: Secondary | ICD-10-CM | POA: Diagnosis not present

## 2021-05-29 DIAGNOSIS — G8929 Other chronic pain: Secondary | ICD-10-CM | POA: Diagnosis not present

## 2021-05-29 DIAGNOSIS — M5442 Lumbago with sciatica, left side: Secondary | ICD-10-CM | POA: Diagnosis not present

## 2021-06-02 DIAGNOSIS — H524 Presbyopia: Secondary | ICD-10-CM | POA: Diagnosis not present

## 2021-06-03 DIAGNOSIS — Z4689 Encounter for fitting and adjustment of other specified devices: Secondary | ICD-10-CM | POA: Diagnosis not present

## 2021-06-03 DIAGNOSIS — Z7689 Persons encountering health services in other specified circumstances: Secondary | ICD-10-CM | POA: Diagnosis not present

## 2021-06-05 DIAGNOSIS — M48062 Spinal stenosis, lumbar region with neurogenic claudication: Secondary | ICD-10-CM | POA: Diagnosis not present

## 2021-06-05 DIAGNOSIS — M5441 Lumbago with sciatica, right side: Secondary | ICD-10-CM | POA: Diagnosis not present

## 2021-06-05 DIAGNOSIS — G8929 Other chronic pain: Secondary | ICD-10-CM | POA: Diagnosis not present

## 2021-06-05 DIAGNOSIS — M6281 Muscle weakness (generalized): Secondary | ICD-10-CM | POA: Diagnosis not present

## 2021-06-05 DIAGNOSIS — M5442 Lumbago with sciatica, left side: Secondary | ICD-10-CM | POA: Diagnosis not present

## 2021-06-10 DIAGNOSIS — M5442 Lumbago with sciatica, left side: Secondary | ICD-10-CM | POA: Diagnosis not present

## 2021-06-10 DIAGNOSIS — M48062 Spinal stenosis, lumbar region with neurogenic claudication: Secondary | ICD-10-CM | POA: Diagnosis not present

## 2021-06-10 DIAGNOSIS — G8929 Other chronic pain: Secondary | ICD-10-CM | POA: Diagnosis not present

## 2021-06-10 DIAGNOSIS — M6281 Muscle weakness (generalized): Secondary | ICD-10-CM | POA: Diagnosis not present

## 2021-06-10 DIAGNOSIS — M5441 Lumbago with sciatica, right side: Secondary | ICD-10-CM | POA: Diagnosis not present

## 2021-06-13 DIAGNOSIS — G8929 Other chronic pain: Secondary | ICD-10-CM | POA: Diagnosis not present

## 2021-06-13 DIAGNOSIS — M5441 Lumbago with sciatica, right side: Secondary | ICD-10-CM | POA: Diagnosis not present

## 2021-06-13 DIAGNOSIS — M5442 Lumbago with sciatica, left side: Secondary | ICD-10-CM | POA: Diagnosis not present

## 2021-06-13 DIAGNOSIS — M6281 Muscle weakness (generalized): Secondary | ICD-10-CM | POA: Diagnosis not present

## 2021-06-13 DIAGNOSIS — M48062 Spinal stenosis, lumbar region with neurogenic claudication: Secondary | ICD-10-CM | POA: Diagnosis not present

## 2021-06-16 DIAGNOSIS — G8929 Other chronic pain: Secondary | ICD-10-CM | POA: Diagnosis not present

## 2021-06-16 DIAGNOSIS — M6281 Muscle weakness (generalized): Secondary | ICD-10-CM | POA: Diagnosis not present

## 2021-06-16 DIAGNOSIS — M48062 Spinal stenosis, lumbar region with neurogenic claudication: Secondary | ICD-10-CM | POA: Diagnosis not present

## 2021-06-16 DIAGNOSIS — M5442 Lumbago with sciatica, left side: Secondary | ICD-10-CM | POA: Diagnosis not present

## 2021-06-16 DIAGNOSIS — M5441 Lumbago with sciatica, right side: Secondary | ICD-10-CM | POA: Diagnosis not present

## 2021-06-20 DIAGNOSIS — G8929 Other chronic pain: Secondary | ICD-10-CM | POA: Diagnosis not present

## 2021-06-20 DIAGNOSIS — M5441 Lumbago with sciatica, right side: Secondary | ICD-10-CM | POA: Diagnosis not present

## 2021-06-20 DIAGNOSIS — M5442 Lumbago with sciatica, left side: Secondary | ICD-10-CM | POA: Diagnosis not present

## 2021-06-20 DIAGNOSIS — M48062 Spinal stenosis, lumbar region with neurogenic claudication: Secondary | ICD-10-CM | POA: Diagnosis not present

## 2021-06-20 DIAGNOSIS — M6281 Muscle weakness (generalized): Secondary | ICD-10-CM | POA: Diagnosis not present

## 2021-06-23 DIAGNOSIS — M48062 Spinal stenosis, lumbar region with neurogenic claudication: Secondary | ICD-10-CM | POA: Diagnosis not present

## 2021-06-23 DIAGNOSIS — M5442 Lumbago with sciatica, left side: Secondary | ICD-10-CM | POA: Diagnosis not present

## 2021-06-23 DIAGNOSIS — M6281 Muscle weakness (generalized): Secondary | ICD-10-CM | POA: Diagnosis not present

## 2021-06-23 DIAGNOSIS — G8929 Other chronic pain: Secondary | ICD-10-CM | POA: Diagnosis not present

## 2021-06-23 DIAGNOSIS — M5441 Lumbago with sciatica, right side: Secondary | ICD-10-CM | POA: Diagnosis not present

## 2021-06-30 DIAGNOSIS — G8929 Other chronic pain: Secondary | ICD-10-CM | POA: Diagnosis not present

## 2021-06-30 DIAGNOSIS — M5442 Lumbago with sciatica, left side: Secondary | ICD-10-CM | POA: Diagnosis not present

## 2021-06-30 DIAGNOSIS — M5441 Lumbago with sciatica, right side: Secondary | ICD-10-CM | POA: Diagnosis not present

## 2021-06-30 DIAGNOSIS — M48062 Spinal stenosis, lumbar region with neurogenic claudication: Secondary | ICD-10-CM | POA: Diagnosis not present

## 2021-06-30 DIAGNOSIS — M6281 Muscle weakness (generalized): Secondary | ICD-10-CM | POA: Diagnosis not present

## 2021-07-02 DIAGNOSIS — G8929 Other chronic pain: Secondary | ICD-10-CM | POA: Diagnosis not present

## 2021-07-02 DIAGNOSIS — M5442 Lumbago with sciatica, left side: Secondary | ICD-10-CM | POA: Diagnosis not present

## 2021-07-02 DIAGNOSIS — M5441 Lumbago with sciatica, right side: Secondary | ICD-10-CM | POA: Diagnosis not present

## 2021-07-02 DIAGNOSIS — M6281 Muscle weakness (generalized): Secondary | ICD-10-CM | POA: Diagnosis not present

## 2021-07-02 DIAGNOSIS — M48062 Spinal stenosis, lumbar region with neurogenic claudication: Secondary | ICD-10-CM | POA: Diagnosis not present

## 2021-07-09 DIAGNOSIS — M5441 Lumbago with sciatica, right side: Secondary | ICD-10-CM | POA: Diagnosis not present

## 2021-07-09 DIAGNOSIS — G8929 Other chronic pain: Secondary | ICD-10-CM | POA: Diagnosis not present

## 2021-07-09 DIAGNOSIS — M7662 Achilles tendinitis, left leg: Secondary | ICD-10-CM | POA: Diagnosis not present

## 2021-07-09 DIAGNOSIS — M6281 Muscle weakness (generalized): Secondary | ICD-10-CM | POA: Diagnosis not present

## 2021-07-09 DIAGNOSIS — M48062 Spinal stenosis, lumbar region with neurogenic claudication: Secondary | ICD-10-CM | POA: Diagnosis not present

## 2021-07-09 DIAGNOSIS — M5442 Lumbago with sciatica, left side: Secondary | ICD-10-CM | POA: Diagnosis not present

## 2021-07-17 DIAGNOSIS — M48062 Spinal stenosis, lumbar region with neurogenic claudication: Secondary | ICD-10-CM | POA: Diagnosis not present

## 2021-07-17 DIAGNOSIS — M5442 Lumbago with sciatica, left side: Secondary | ICD-10-CM | POA: Diagnosis not present

## 2021-07-17 DIAGNOSIS — M6281 Muscle weakness (generalized): Secondary | ICD-10-CM | POA: Diagnosis not present

## 2021-07-17 DIAGNOSIS — G8929 Other chronic pain: Secondary | ICD-10-CM | POA: Diagnosis not present

## 2021-07-17 DIAGNOSIS — M5441 Lumbago with sciatica, right side: Secondary | ICD-10-CM | POA: Diagnosis not present

## 2021-07-29 DIAGNOSIS — M5441 Lumbago with sciatica, right side: Secondary | ICD-10-CM | POA: Diagnosis not present

## 2021-07-29 DIAGNOSIS — M5442 Lumbago with sciatica, left side: Secondary | ICD-10-CM | POA: Diagnosis not present

## 2021-07-29 DIAGNOSIS — M48062 Spinal stenosis, lumbar region with neurogenic claudication: Secondary | ICD-10-CM | POA: Diagnosis not present

## 2021-07-29 DIAGNOSIS — G8929 Other chronic pain: Secondary | ICD-10-CM | POA: Diagnosis not present

## 2021-07-29 DIAGNOSIS — M6281 Muscle weakness (generalized): Secondary | ICD-10-CM | POA: Diagnosis not present

## 2021-08-06 DIAGNOSIS — M7672 Peroneal tendinitis, left leg: Secondary | ICD-10-CM | POA: Diagnosis not present

## 2021-08-06 DIAGNOSIS — B353 Tinea pedis: Secondary | ICD-10-CM | POA: Diagnosis not present

## 2021-08-06 DIAGNOSIS — M9262 Juvenile osteochondrosis of tarsus, left ankle: Secondary | ICD-10-CM | POA: Diagnosis not present

## 2021-08-06 DIAGNOSIS — M79672 Pain in left foot: Secondary | ICD-10-CM | POA: Diagnosis not present

## 2021-08-06 DIAGNOSIS — Q6672 Congenital pes cavus, left foot: Secondary | ICD-10-CM | POA: Diagnosis not present

## 2021-08-06 DIAGNOSIS — Q6671 Congenital pes cavus, right foot: Secondary | ICD-10-CM | POA: Diagnosis not present

## 2021-08-06 DIAGNOSIS — L603 Nail dystrophy: Secondary | ICD-10-CM | POA: Diagnosis not present

## 2021-08-06 DIAGNOSIS — M7732 Calcaneal spur, left foot: Secondary | ICD-10-CM | POA: Diagnosis not present

## 2021-08-06 DIAGNOSIS — B351 Tinea unguium: Secondary | ICD-10-CM | POA: Diagnosis not present

## 2021-08-11 DIAGNOSIS — M5441 Lumbago with sciatica, right side: Secondary | ICD-10-CM | POA: Diagnosis not present

## 2021-08-11 DIAGNOSIS — M6281 Muscle weakness (generalized): Secondary | ICD-10-CM | POA: Diagnosis not present

## 2021-08-11 DIAGNOSIS — G8929 Other chronic pain: Secondary | ICD-10-CM | POA: Diagnosis not present

## 2021-08-11 DIAGNOSIS — M48062 Spinal stenosis, lumbar region with neurogenic claudication: Secondary | ICD-10-CM | POA: Diagnosis not present

## 2021-08-11 DIAGNOSIS — M5442 Lumbago with sciatica, left side: Secondary | ICD-10-CM | POA: Diagnosis not present

## 2021-08-14 DIAGNOSIS — M48062 Spinal stenosis, lumbar region with neurogenic claudication: Secondary | ICD-10-CM | POA: Diagnosis not present

## 2021-09-01 DIAGNOSIS — M5442 Lumbago with sciatica, left side: Secondary | ICD-10-CM | POA: Diagnosis not present

## 2021-09-01 DIAGNOSIS — M5441 Lumbago with sciatica, right side: Secondary | ICD-10-CM | POA: Diagnosis not present

## 2021-09-01 DIAGNOSIS — G8929 Other chronic pain: Secondary | ICD-10-CM | POA: Diagnosis not present

## 2021-09-01 DIAGNOSIS — M48062 Spinal stenosis, lumbar region with neurogenic claudication: Secondary | ICD-10-CM | POA: Diagnosis not present

## 2021-09-02 ENCOUNTER — Other Ambulatory Visit: Payer: Self-pay | Admitting: Orthopedic Surgery

## 2021-09-02 DIAGNOSIS — M2391 Unspecified internal derangement of right knee: Secondary | ICD-10-CM

## 2021-09-02 DIAGNOSIS — M1711 Unilateral primary osteoarthritis, right knee: Secondary | ICD-10-CM

## 2021-09-02 DIAGNOSIS — L608 Other nail disorders: Secondary | ICD-10-CM | POA: Diagnosis not present

## 2021-09-03 ENCOUNTER — Other Ambulatory Visit: Payer: Self-pay

## 2021-09-03 ENCOUNTER — Ambulatory Visit
Admission: RE | Admit: 2021-09-03 | Discharge: 2021-09-03 | Disposition: A | Discharge status: Home / Self Care | Payer: Medicare HMO | Source: Ambulatory Visit | Attending: Orthopedic Surgery | Admitting: Orthopedic Surgery

## 2021-09-03 DIAGNOSIS — M2391 Unspecified internal derangement of right knee: Secondary | ICD-10-CM | POA: Diagnosis not present

## 2021-09-03 DIAGNOSIS — M25561 Pain in right knee: Secondary | ICD-10-CM | POA: Diagnosis not present

## 2021-09-03 DIAGNOSIS — M1711 Unilateral primary osteoarthritis, right knee: Secondary | ICD-10-CM | POA: Insufficient documentation

## 2021-09-17 DIAGNOSIS — M1711 Unilateral primary osteoarthritis, right knee: Secondary | ICD-10-CM | POA: Diagnosis not present

## 2021-11-12 ENCOUNTER — Other Ambulatory Visit: Payer: Self-pay | Admitting: Obstetrics and Gynecology

## 2021-11-12 DIAGNOSIS — Z1231 Encounter for screening mammogram for malignant neoplasm of breast: Secondary | ICD-10-CM

## 2021-11-26 ENCOUNTER — Other Ambulatory Visit: Payer: Self-pay | Admitting: Podiatry

## 2021-11-26 ENCOUNTER — Other Ambulatory Visit (HOSPITAL_COMMUNITY): Payer: Self-pay | Admitting: Podiatry

## 2021-11-26 DIAGNOSIS — M19072 Primary osteoarthritis, left ankle and foot: Secondary | ICD-10-CM

## 2021-11-26 DIAGNOSIS — M7672 Peroneal tendinitis, left leg: Secondary | ICD-10-CM

## 2021-12-06 ENCOUNTER — Other Ambulatory Visit: Payer: Self-pay

## 2021-12-06 ENCOUNTER — Ambulatory Visit
Admission: RE | Admit: 2021-12-06 | Discharge: 2021-12-06 | Disposition: A | Discharge status: Home / Self Care | Payer: Medicare Other | Source: Ambulatory Visit | Attending: Podiatry | Admitting: Podiatry

## 2021-12-06 DIAGNOSIS — M7672 Peroneal tendinitis, left leg: Secondary | ICD-10-CM | POA: Diagnosis present

## 2021-12-06 DIAGNOSIS — M19072 Primary osteoarthritis, left ankle and foot: Secondary | ICD-10-CM

## 2022-01-07 ENCOUNTER — Ambulatory Visit
Admission: RE | Admit: 2022-01-07 | Discharge: 2022-01-07 | Disposition: A | Discharge status: Home / Self Care | Payer: Medicare Other | Source: Ambulatory Visit | Attending: Obstetrics and Gynecology | Admitting: Obstetrics and Gynecology

## 2022-01-07 DIAGNOSIS — Z1231 Encounter for screening mammogram for malignant neoplasm of breast: Secondary | ICD-10-CM | POA: Diagnosis present

## 2022-03-14 IMAGING — MG MM DIGITAL SCREENING BILAT W/ TOMO AND CAD
8 series · 8 of 24 positions shown · non-contrast
Comparison: Previous exam(s).

CLINICAL DATA: Screening.

EXAM:
DIGITAL SCREENING BILATERAL MAMMOGRAM WITH TOMOSYNTHESIS AND CAD
TECHNIQUE: Bilateral screening digital craniocaudal and mediolateral oblique
mammograms were obtained. Bilateral screening digital breast
tomosynthesis was performed. The images were evaluated with
computer-aided detection.

[R MLO synth-2D]
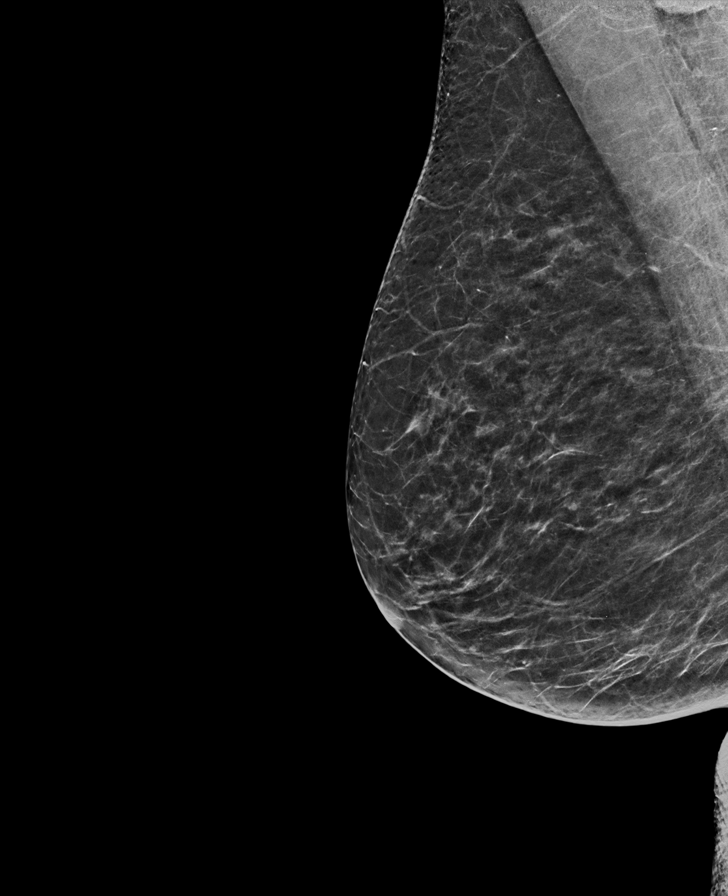

[R CC synth-2D]
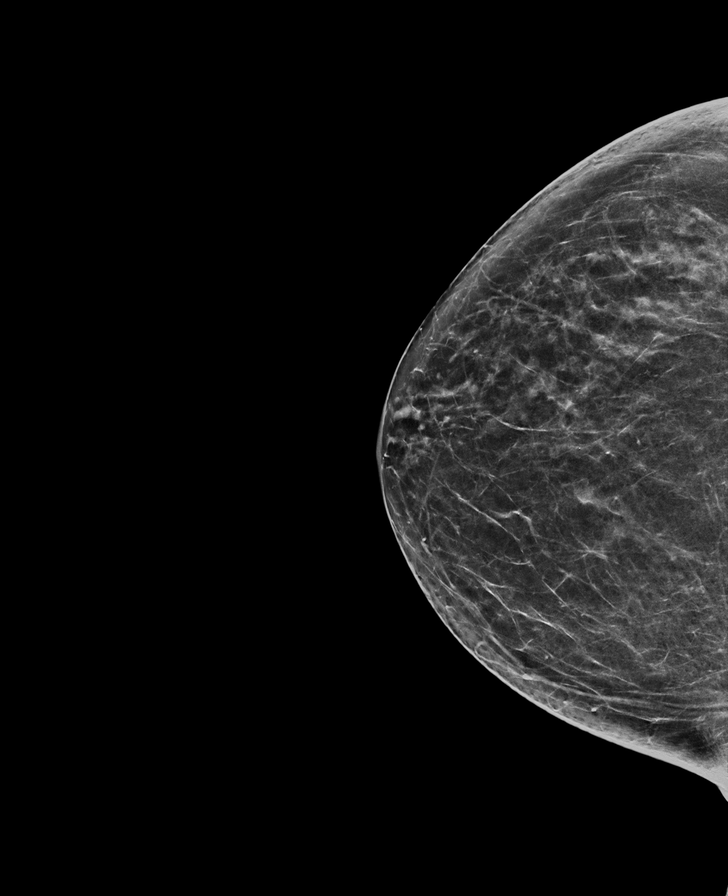

[L MLO synth-2D]
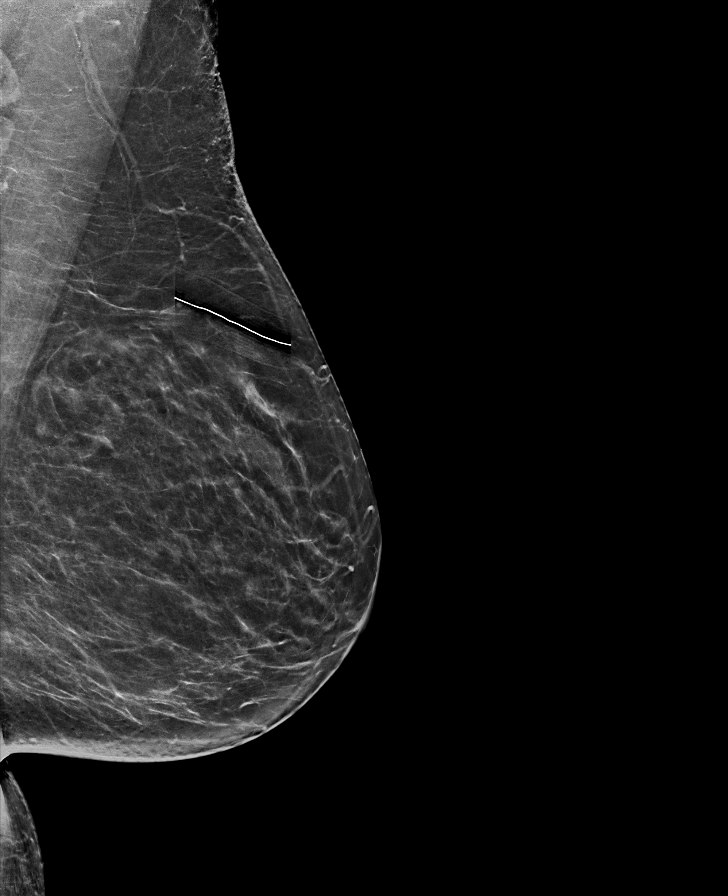

[L CC synth-2D]
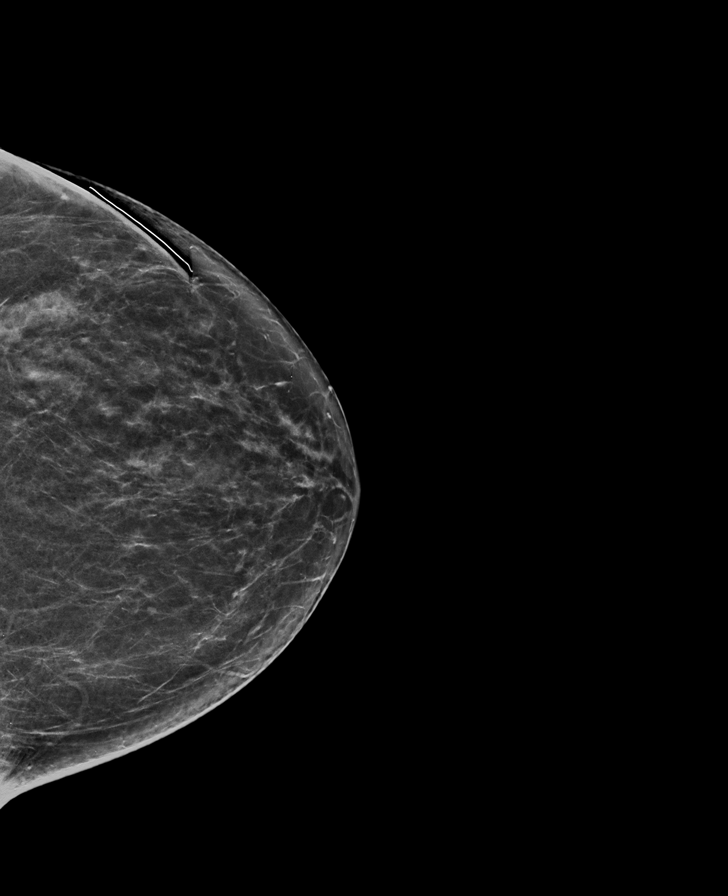

[R MLO tomo · tomo slice 35/69.0]
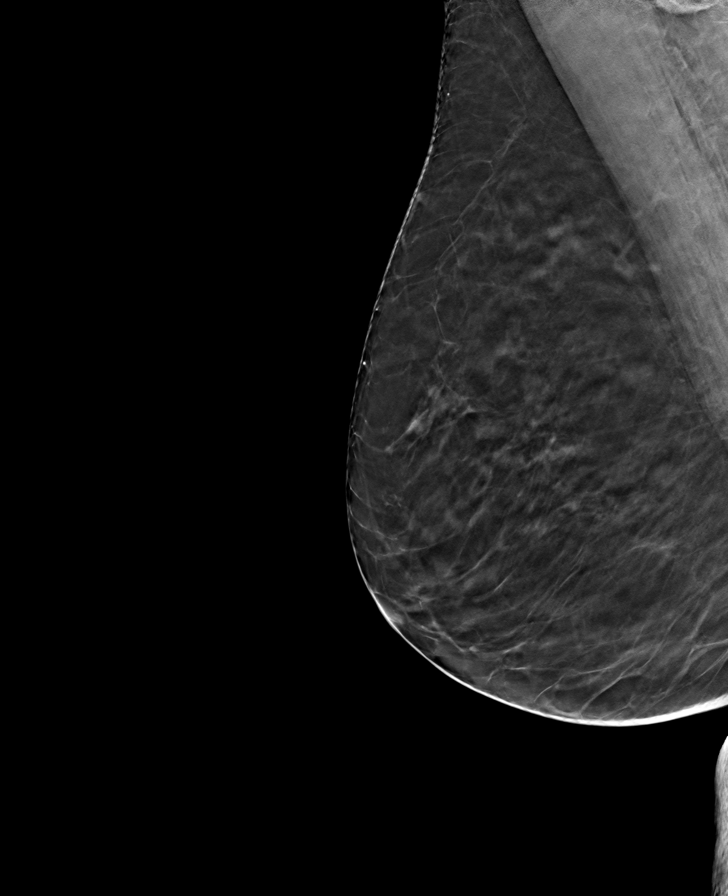

[R CC tomo · tomo slice 35/69.0]
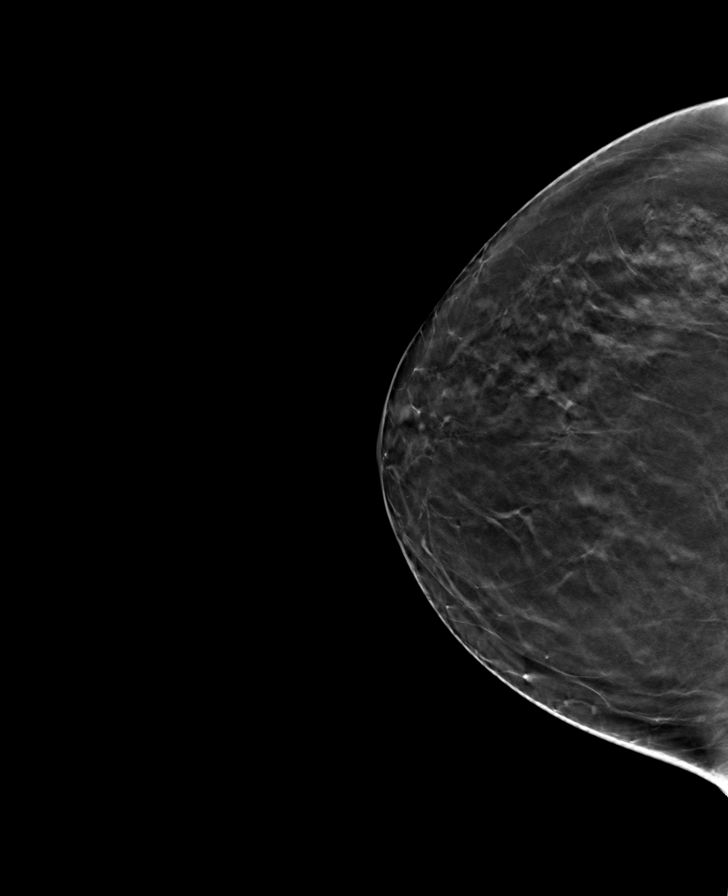

[L CC tomo · tomo slice 35/69.0]
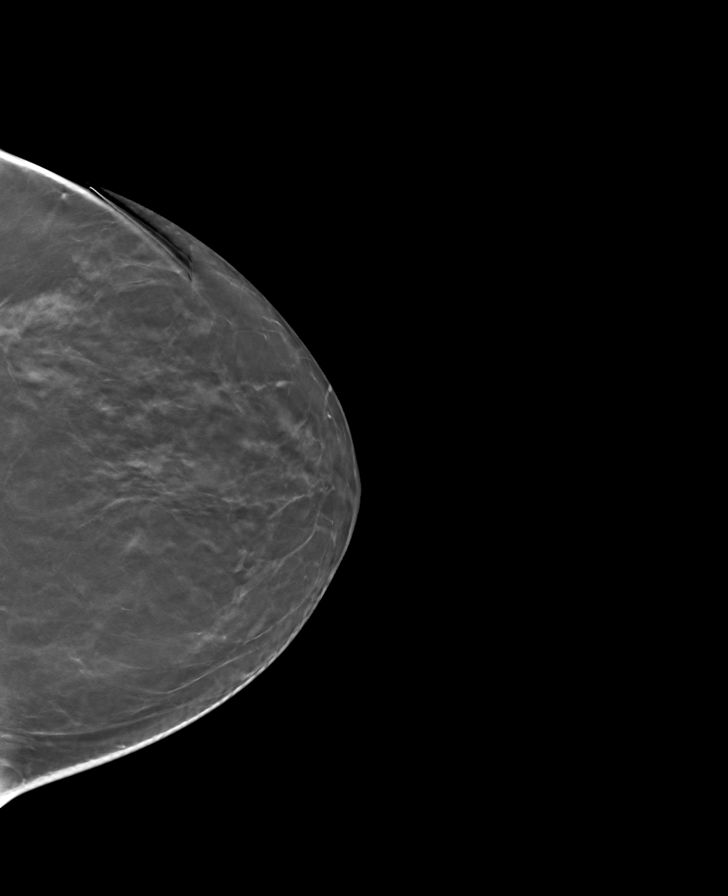

[L MLO tomo · tomo slice 35/69.0]
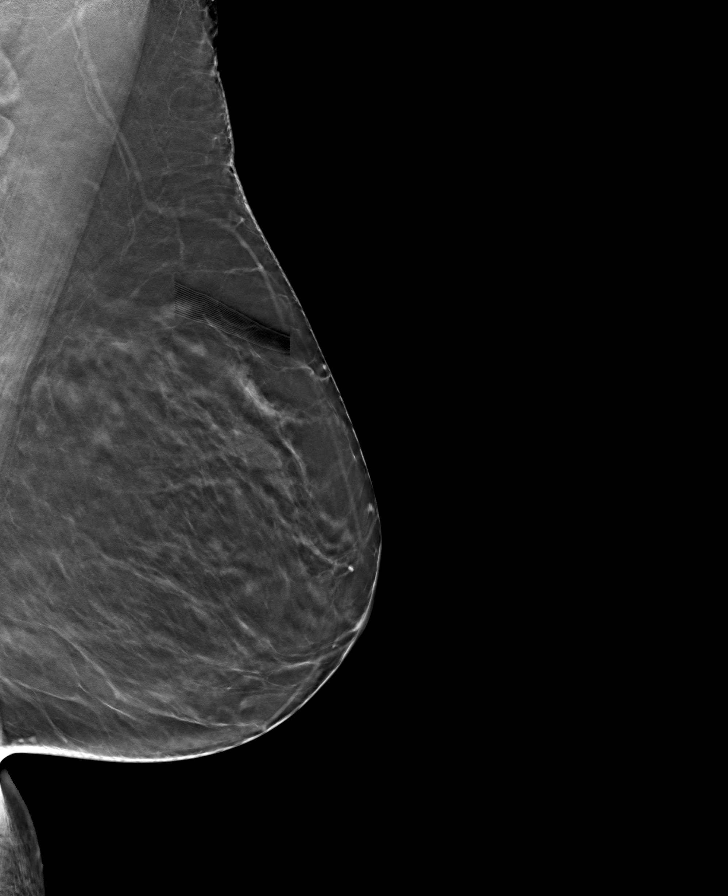

[8 of 24 positions shown; findings below may reference images not displayed]

ACR Breast Density Category b: There are scattered areas of
fibroglandular density.
FINDINGS: There are no findings suspicious for malignancy. The images were
evaluated with computer-aided detection.
IMPRESSION: No mammographic evidence of malignancy. A result letter of this
screening mammogram will be mailed directly to the patient.

RECOMMENDATION:
Screening mammogram in one year. (Code:[id number])

BI-RADS CATEGORY  1: Negative.

## 2022-11-25 ENCOUNTER — Other Ambulatory Visit: Payer: Self-pay | Admitting: Internal Medicine

## 2022-11-25 DIAGNOSIS — Z1231 Encounter for screening mammogram for malignant neoplasm of breast: Secondary | ICD-10-CM

## 2023-01-11 ENCOUNTER — Ambulatory Visit: Payer: Medicare Other

## 2023-01-13 ENCOUNTER — Ambulatory Visit
Admission: RE | Admit: 2023-01-13 | Discharge: 2023-01-13 | Disposition: A | Discharge status: Home / Self Care | Payer: Medicare Other | Source: Ambulatory Visit | Attending: Internal Medicine | Admitting: Internal Medicine

## 2023-01-13 ENCOUNTER — Ambulatory Visit: Payer: Medicare Other

## 2023-01-13 DIAGNOSIS — Z1231 Encounter for screening mammogram for malignant neoplasm of breast: Secondary | ICD-10-CM

## 2023-05-04 ENCOUNTER — Ambulatory Visit: Payer: Medicare Other | Admitting: Physical Therapy

## 2023-05-06 ENCOUNTER — Ambulatory Visit: Payer: Medicare Other | Admitting: Physical Therapy

## 2023-05-10 ENCOUNTER — Encounter: Payer: Medicare Other | Admitting: Physical Therapy

## 2023-05-20 ENCOUNTER — Encounter: Payer: Medicare Other | Admitting: Physical Therapy

## 2023-05-27 ENCOUNTER — Encounter: Payer: Medicare Other | Admitting: Physical Therapy

## 2023-06-03 ENCOUNTER — Encounter: Payer: Medicare Other | Admitting: Physical Therapy

## 2023-06-10 ENCOUNTER — Encounter: Payer: Self-pay | Admitting: Physical Therapy

## 2023-06-10 ENCOUNTER — Ambulatory Visit: Payer: Medicare Other | Attending: Obstetrics and Gynecology | Admitting: Physical Therapy

## 2023-06-10 DIAGNOSIS — M25561 Pain in right knee: Secondary | ICD-10-CM | POA: Diagnosis present

## 2023-06-10 DIAGNOSIS — M533 Sacrococcygeal disorders, not elsewhere classified: Secondary | ICD-10-CM | POA: Insufficient documentation

## 2023-06-10 DIAGNOSIS — M25562 Pain in left knee: Secondary | ICD-10-CM | POA: Diagnosis present

## 2023-06-10 DIAGNOSIS — M5459 Other low back pain: Secondary | ICD-10-CM | POA: Insufficient documentation

## 2023-06-10 DIAGNOSIS — G8929 Other chronic pain: Secondary | ICD-10-CM | POA: Diagnosis present

## 2023-06-10 DIAGNOSIS — R2689 Other abnormalities of gait and mobility: Secondary | ICD-10-CM | POA: Diagnosis present

## 2023-06-10 NOTE — Patient Instructions (Signed)
   Proper body mechanics with getting out of a chair to decrease strain  on back &pelvic floor   Avoid holding your breath when Getting out of the chair:  Scoot to front part of chair chair Heels behind knees, feet are hip width apart, nose over toes  Inhale like you are smelling roses Exhale to stand   __  Sitting with feet on ground, four points of contact Catch yourself crossing ankles and thighs  __  Try shoe lift in heel and toe box with R shoe this week  If there is more tingling pain down L leg, remove the lift pieces and dispose.   If no increased pain this week, maintain in R shoe all week and wear same shoe

## 2023-06-10 NOTE — Therapy (Addendum)
OUTPATIENT PHYSICAL THERAPY EVALUATION   Patient Name: Tracy Rodgers MRN: [id number] DOB:January 24, 1946, 77 y.o., female Today's Date: 06/10/2023   PT End of Session - 06/10/23 1114     Visit Number 1    Number of Visits 10    Date for PT Re-Evaluation 08/19/23    PT Start Time 1110    PT Stop Time 1150    PT Time Calculation (min) 40 min    Activity Tolerance Patient tolerated treatment well;No increased pain    Behavior During Therapy WFL for tasks assessed/performed             Past Medical History:  Diagnosis Date   Achilles tendinitis    Allergic rhinitis    Arthritis    Cystocele, unspecified (CODE)    Diverticulosis    Dyspareunia, female    Elevated cholesterol    GERD (gastroesophageal reflux disease)    History of heavy periods    HLD (hyperlipidemia)    Impaired glucose tolerance    Melanoma (HCC)    on her back   Melanoma in situ of back (HCC)    Osteopenia    Painful menstrual periods    Rectocele    Urticaria    Uterine prolapse    Vitamin D deficiency    Past Surgical History:  Procedure Laterality Date   APPENDECTOMY     BREAST BIOPSY Left 1991   neg   BREAST EXCISIONAL BIOPSY Left 1991   broken nose     COLONOSCOPY     COLONOSCOPY N/A 12/30/2020   Procedure: COLONOSCOPY;  Surgeon: Regis Bill, MD;  Location: [facility] ENDOSCOPY;  Service: Endoscopy;  Laterality: N/A;  Patient says she needs Scolpamine patch after anesthesia for nausea.   SEPTOPLASTY     SKIN CANCER EXCISION     Patient Active Problem List   Diagnosis Date Noted   History of melanoma excision 07/21/2016   Melanosis of vulva 07/21/2016   Rectocele 07/21/2016   Midline cystocele 07/21/2016   Left knee pain 12/23/2015   History of motion sickness 09/20/2014   Allergic rhinitis 09/13/2014   Gastroesophageal reflux 09/13/2014   Hyperlipidemia, unspecified 09/13/2014   Impaired glucose tolerance 09/13/2014   Vaginal atrophy 09/13/2014   Vitamin D deficiency  09/13/2014   History of adenomatous polyp of colon 08/14/2014    PCP: Nemiah Commander  REFERRING PROVIDER: Renita Papa DIAG: Uterine prolapse without mention of vaginal wall prolapse  Rationale for Evaluation and Treatment Rehabilitation  THERAPY DIAG:  Other abnormalities of gait and mobility  Chronic pain of both knees  Sacrococcygeal disorders, not elsewhere classified  ONSET DATE:   SUBJECTIVE:  SUBJECTIVE STATEMENT on EVAL 06/10/23 : 1) difficulty emptying bladder / leakage :  Pt leans forward to pees again . This started 4-5 years ago. Pt has been wearing a pessary for 5 years which helped with emptying her bladder.  Currently pt gets her pessary checked every 6 months with Dr. Dalbert Garnet. Denied straining with BMs. Pt has complete movements.  Leakage is occurring all the time. Pt changes Poise pads once a day.    2) CLBP :  at worst 8-10/10 occurs with strenuous activities, gardening, when getting out of bed, Pt has a broken tailbone 20+ years ago.   Radiating pain on L hip to lateral L knee  Occurs with standing, walking > 15 min. It does not happen with gardening/  swimming.      3) R groin pain started 2 months ago:  started as a pinching at 9/10  that comes randomly when walking, standing, stepping   and it goes away with sitting and eases 3-5 min. Does not radiate.    4) B knee pain,: L knee pain is worst than R  level 8/10 . OA . Completed PT for L patella that was out of alignment. It started hurting again 2 years ago. R knee started to hurt. Pt was told collagen is lacking  . Completed PT for R knee as well. PT gave up on her and was recommended on knee replacement. Pt saw a second / third opinion which advised her to TKA is not needed at this time.     PERTINENT HISTORY:  OA on B  knees , no prolapse surgeries,  appendectomy, 3 vaginal deliveries,  Hx of fall and fell on her nose 20 years ago and also had a fall on her tailbone before then   Fitness:  pool exercise in her personal pool ( running, riding bike, jumping jacks, PT HEP for her back   PAIN:  Are you having pain? Yes: see above  PRECAUTIONS: None  WEIGHT BEARING RESTRICTIONS: No  FALLS:  Has patient fallen in last 6 months? No   LIVING ENVIRONMENT: Lives with: lives with their spouse Lives in: house  Stairs: stoop STE , one story home   OCCUPATION: retired from Administrator, sports,  Current hobbies: gardening, travel ,   PLOF: Independent  PATIENT GOALS:  Not pee in her pants, not go to the bathroom so often     OBJECTIVE:    [facility] PT Assessment - 06/10/23 1147       Strength   Overall Strength Comments R hip flexion  4-/5, L 5/5      Palpation   SI assessment  standing and sitting: L shoulder/ iliac crest higher      Ambulation/Gait   Gait Comments preTx: 1.19 m/s ( decreased RLE stance, limited posterior rotation of pelvis),  ( post Tx: with shoe lift in toe and heel box: reciporcal pattern)             [facility] Adult PT Treatment/Exercise - 06/10/23 1147       Therapeutic Activites    Therapeutic Activities Work [employer];Other Therapeutic Activities    Other Therapeutic Activities explained role of shoe lift for correcting pelvic and shoulder misalignment      Neuro Re-ed    Neuro Re-ed Details  cued for sitting mechanics,               HOME EXERCISE PROGRAM: See pt instruction section    ASSESSMENT:  CLINICAL IMPRESSION:  Pt is  a  77  yo  who presents with difficulty emptying bladder / leakage, CLBP , R groin pain, B knee pain which impact QOL, ADL, fitness, and community activities.   Pt's musculoskeletal assessment revealed uneven pelvic girdle and shoulder height, asymmetries to gait pattern, limited spinal /pelvic mobility, dyscoordination and  strength of pelvic floor mm, hip weakness, poor body mechanics which places strain on the abdominal/pelvic floor mm. These are deficits that indicate an ineffective intraabdominal pressure system associated with increased risk for pt's Sx.    Pt was provided education on etiology of Sx with anatomy, physiology explanation with images along with the benefits of customized pelvic PT Tx based on pt's medical conditions and musculoskeletal deficits.  Explained the physiology of deep core mm coordination and roles of pelvic floor function in urination, defecation, sexual function, and postural control with deep core mm system.   Regional interdependent approaches will yield greater benefits in pt's POC.   Following Tx today which pt tolerated without complaints,  pt demo'd proper body mechanics to minimize straining pelvic floor.  Provided shoe lift in heel and toe box of R shoe which levelled pelvic and shoulder alignment and improved gait pattern. Plan to assess whether this will help with pt's multiple region of pain.   Plan to address realignment of spine/ pelvis at next session to help promote balance/ minimize falls and optimize IAP system for improved pelvic floor function.  Plan to address pelvic floor issues once pelvis and spine are realigned to yield better outcomes.   Pt benefits from skilled PT.    OBJECTIVE IMPAIRMENTS decreased activity tolerance, decreased coordination, decreased endurance, decreased mobility, difficulty walking, decreased ROM, decreased strength, decreased safety awareness, hypomobility, increased muscle spasms, impaired flexibility, improper body mechanics, postural dysfunction, and pain. scar restrictions   ACTIVITY LIMITATIONS  self-care,  sleep, home chores, work tasks    PARTICIPATION LIMITATIONS:  community, gym activities    PERSONAL FACTORS        are also affecting patient's functional outcome.    REHAB POTENTIAL: Good   CLINICAL DECISION MAKING:  Evolving/moderate complexity   EVALUATION COMPLEXITY: Moderate    PATIENT EDUCATION:    Education details: Showed pt anatomy images. Explained muscles attachments/ connection, physiology of deep core system/ spinal- thoracic-pelvis-lower kinetic chain as they relate to pt's presentation, Sx, and past Hx. Explained what and how these areas of deficits need to be restored to balance and function    See Therapeutic activity / neuromuscular re-education section  Answered pt's questions.   Person educated: Patient Education method: Explanation, Demonstration, Tactile cues, Verbal cues, and Handouts Education comprehension: verbalized understanding, returned demonstration, verbal cues required, tactile cues required, and needs further education     PLAN: PT FREQUENCY: 1x/week   PT DURATION: 10 weeks   PLANNED INTERVENTIONS: Therapeutic exercises, Therapeutic activity, Neuromuscular re-education, Balance training, Gait training, Patient/Family education, Self Care, Joint mobilization, Spinal mobilization, Moist heat, Taping, and Manual therapy, dry needling.   PLAN FOR NEXT SESSION: See clinical impression for plan     GOALS: Goals reviewed with patient? Yes  SHORT TERM GOALS: Target date: 07/08/2023    Pt will demo IND with HEP                    Baseline: Not IND            Goal status: INITIAL   LONG TERM GOALS: Target date: 08/19/2023    1.Pt will demo proper deep core coordination without chest  breathing and optimal excursion of diaphragm/pelvic floor in order to promote spinal stability and pelvic floor function  Baseline: dyscoordination Goal status: INITIAL  2.  Pt will demo > 5 pt change on FOTO  to improve QOL and function  PFDI Urinary baseline - 17 Lower score = better function  Urinary Problem baseline-  56 Higher score = better function  Prolapse baseline - 8 Lower score = better function  Lumber baseline  -52  Higher score = better  function   Goal status: INITIAL  3.  Pt will demo proper body mechanics in against gravity tasks and ADLs  work tasks, fitness  to minimize straining pelvic floor / back    Baseline: not IND, improper form that places strain on pelvic floor  Goal status: INITIAL    4. Pt will demo increased gait speed > 1.3 m/s with reciprocal gait pattern, longer stride length  in order to ambulate safely in community and return to fitness routine  Baseline:   Goal status: INITIAL    5. Pt reports decreased urge incontinence to <1 a month  in order improve hygiene and continence  Baseline:  urge incontinence 2 x month  Goal status: INITIAL   6. Pt will no longer lean forward for emptying bladder and demo proper coordination of pelvic floor for lengthening and contraction with deep core coordination   Baseline:  Pt leans forward to pee and complete emptying  Goal status: INITIAL  7.  Pt will report cook and stand in line for > 30 min without radiating pain down the L knee when walking  Baseline:  Radiating pain on L hip to lateral L knee  Occurs with standing, walking > 15 min   Goal status: INITIAL       Mariane Masters, PT 06/10/2023, 11:16 AM

## 2023-06-17 ENCOUNTER — Ambulatory Visit: Payer: Medicare Other | Admitting: Physical Therapy

## 2023-06-17 DIAGNOSIS — M5459 Other low back pain: Secondary | ICD-10-CM

## 2023-06-17 DIAGNOSIS — G8929 Other chronic pain: Secondary | ICD-10-CM

## 2023-06-17 DIAGNOSIS — M533 Sacrococcygeal disorders, not elsewhere classified: Secondary | ICD-10-CM

## 2023-06-17 DIAGNOSIS — R2689 Other abnormalities of gait and mobility: Secondary | ICD-10-CM

## 2023-06-17 NOTE — Therapy (Addendum)
OUTPATIENT PHYSICAL THERAPY TREATMENT    Patient Name: Tracy Rodgers MRN: [id number] DOB:[birth year], 77 y.o., female Today's Date: 06/17/2023   PT End of Session - 06/17/23 1113     Visit Number 2    Number of Visits 10    Date for PT Re-Evaluation 08/19/23    PT Start Time 1110    PT Stop Time 1150    PT Time Calculation (min) 40 min    Activity Tolerance Patient tolerated treatment well;No increased pain    Behavior During Therapy WFL for tasks assessed/performed             Past Medical History:  Diagnosis Date   Achilles tendinitis    Allergic rhinitis    Arthritis    Cystocele, unspecified (CODE)    Diverticulosis    Dyspareunia, female    Elevated cholesterol    GERD (gastroesophageal reflux disease)    History of heavy periods    HLD (hyperlipidemia)    Impaired glucose tolerance    Melanoma (HCC)    on her back   Melanoma in situ of back (HCC)    Osteopenia    Painful menstrual periods    Rectocele    Urticaria    Uterine prolapse    Vitamin D deficiency    Past Surgical History:  Procedure Laterality Date   APPENDECTOMY     BREAST BIOPSY Left 1991   neg   BREAST EXCISIONAL BIOPSY Left 1991   broken nose     COLONOSCOPY     COLONOSCOPY N/A 12/30/2020   Procedure: COLONOSCOPY;  Surgeon: Regis Bill, MD;  Location: [facility] ENDOSCOPY;  Service: Endoscopy;  Laterality: N/A;  Patient says she needs Scolpamine patch after anesthesia for nausea.   SEPTOPLASTY     SKIN CANCER EXCISION     Patient Active Problem List   Diagnosis Date Noted   History of melanoma excision 07/21/2016   Melanosis of vulva 07/21/2016   Rectocele 07/21/2016   Midline cystocele 07/21/2016   Left knee pain 12/23/2015   History of motion sickness 09/20/2014   Allergic rhinitis 09/13/2014   Gastroesophageal reflux 09/13/2014   Hyperlipidemia, unspecified 09/13/2014   Impaired glucose tolerance 09/13/2014   Vaginal atrophy 09/13/2014   Vitamin D deficiency  09/13/2014   History of adenomatous polyp of colon 08/14/2014    PCP: Nemiah Commander  REFERRING PROVIDER: Renita Papa DIAG: Uterine prolapse without mention of vaginal wall prolapse  Rationale for Evaluation and Treatment Rehabilitation  THERAPY DIAG:  Other abnormalities of gait and mobility  Chronic pain of both knees  Other low back pain  Sacrococcygeal disorders, not elsewhere classified  ONSET DATE:   SUBJECTIVE:        SUBJECTIVE STATEMENT TODAY:  Pt left here wearing the shoe lift without pain walking to the car. Pt wore them in the house until the evening. The next morning Friday, she was wearing the shoe lift and noticed tingling in her R knee and thigh. Pt discontinued wearing the shoe lift for the rest of Friday. Saturday morning, did not do the clam shells because pt thought they were hurting knee.    Started to wear shoe lift again on Saturday and did yard work with shoe lift and noticed tingling L hip. Pt is not wearing her shoe lift today.   Pt wore the shoe lift when shopping for atleast 2 hours and had no tingling pain.   Pt is scheduled for shots for her back pain.  Pt noticed she noticed less leakage last week and she noticed pads did not smell bad as well.                                                                                                                                                         SUBJECTIVE STATEMENT on EVAL 06/10/23 :  1) difficulty emptying bladder / leakage :  Pt leans forward to pees again . This started 4-5 years ago. Pt has been wearing a pessary for 5 years which helped with emptying her bladder.  Currently pt gets her pessary checked every 6 months with Dr. Dalbert Garnet. Denied straining with BMs. Pt has complete movements.  Leakage is occurring all the time. Pt changes Poise pads once a day.    2) CLBP :  at worst 8-10/10 occurs with strenuous activities, gardening, when getting out of bed, Pt has a broken tailbone 20+  years ago.   Radiating pain on L hip to lateral L knee  Occurs with standing, walking > 15 min. It does not happen with gardening/  swimming.      3) R groin pain started 2 months ago:  started as a pinching at 9/10  that comes randomly when walking, standing, stepping   and it goes away with sitting and eases 3-5 min. Does not radiate.    4) B knee pain,: L knee pain is worst than R  level 8/10 . OA . Completed PT for L patella that was out of alignment. It started hurting again 2 years ago. R knee started to hurt. Pt was told collagen is lacking  . Completed PT for R knee as well. PT gave up on her and was recommended on knee replacement. Pt saw a second / third opinion which advised her to TKA is not needed at this time.     PERTINENT HISTORY:  OA on B knees , no prolapse surgeries,  appendectomy, 3 vaginal deliveries,  Hx of fall and fell on her nose 20 years ago and also had a fall on her tailbone before then   Fitness:  pool exercise in her personal pool ( running, riding bike, jumping jacks, PT HEP for her back   PAIN:  Are you having pain? Yes: see above  PRECAUTIONS: None  WEIGHT BEARING RESTRICTIONS: No  FALLS:  Has patient fallen in last 6 months? No   LIVING ENVIRONMENT: Lives with: lives with their spouse Lives in: house  Stairs: stoop STE , one story home   OCCUPATION: retired from Administrator, sports,  Current hobbies: gardening, travel ,   PLOF: Independent  PATIENT GOALS:  Not pee in her pants, not go to the bathroom so often     OBJECTIVE:     [facility] PT Assessment - 06/17/23 1136  Palpation   SI assessment  leglength in supine: 84 ASIS medial malleoli R, 87 cm L  ,  standing without shoe lift today:  R shoulder and iliac crest base of patella lowered,  Supine:  12th rib levelled, ASIS R/ medial malleoli R lowered    Palpation comment tightness along paraspinals L, scapular mm attachments L tightness, hypomoile T10-12 L convex              [facility] Adult PT Treatment/Exercise - 06/17/23 1158       Therapeutic Activites    Other Therapeutic Activities explained anatomy and role of leg length difference, plan for future sessions , explained her past Hx of slipped discs and knee injury on L and how it relates to the Tx will help minimize worsening of slipped disc and knee L pain      Neuro Re-ed    Neuro Re-ed Details  cued for HEP to minimize asymmetrical mm tightness on L      Manual Therapy   Manual therapy comments STM/MWM at problem areas noted in assessment to realign spine                HOME EXERCISE PROGRAM: See pt instruction section    ASSESSMENT:  CLINICAL IMPRESSION:  Pt noticed she noticed less leakage last week and she noticed pads did not smell bad as well while she was wearing her shoe lift last week.             Pt did not arrive today with her shoe lift in R shoe today because she had to switch to a different pair of shoes. She showed lowered pelvis, shoter leg based on measurements.  Manual Tx addressed asymmetrical tightness of L paraspinals/ T/L junction hypomobility which helped to realign her shoulder. Pelvis remained uneven post Tx which confirms need for shoe lift.   Pt was explained anatomy , answered pt's questions about her Sx.   Continue to realignment of spine/ pelvis at next session to help address radiating pain from lumbar segments, promote balance/ minimize falls and optimize IAP system for improved pelvic floor function.  Plan to address pelvic floor issues once pelvis and spine are realigned to yield better outcomes. Plan to assess her self-selected exercises she does in the pool to ensure they are not contributing to radiating LBP. Plan to review body mechanics for gardening to address LBP.   Pt is getting shot for low back pain next Monday.   Pt benefits from skilled PT.    OBJECTIVE IMPAIRMENTS decreased activity tolerance, decreased coordination, decreased endurance,  decreased mobility, difficulty walking, decreased ROM, decreased strength, decreased safety awareness, hypomobility, increased muscle spasms, impaired flexibility, improper body mechanics, postural dysfunction, and pain. scar restrictions   ACTIVITY LIMITATIONS  self-care,  sleep, home chores, work tasks    PARTICIPATION LIMITATIONS:  community, gym activities    PERSONAL FACTORS        are also affecting patient's functional outcome.    REHAB POTENTIAL: Good   CLINICAL DECISION MAKING: Evolving/moderate complexity   EVALUATION COMPLEXITY: Moderate    PATIENT EDUCATION:    Education details: Showed pt anatomy images. Explained muscles attachments/ connection, physiology of deep core system/ spinal- thoracic-pelvis-lower kinetic chain as they relate to pt's presentation, Sx, and past Hx. Explained what and how these areas of deficits need to be restored to balance and function    See Therapeutic activity / neuromuscular re-education section  Answered pt's questions.   Person educated: Patient Education method:  Explanation, Demonstration, Tactile cues, Verbal cues, and Handouts Education comprehension: verbalized understanding, returned demonstration, verbal cues required, tactile cues required, and needs further education     PLAN: PT FREQUENCY: 1x/week   PT DURATION: 10 weeks   PLANNED INTERVENTIONS: Therapeutic exercises, Therapeutic activity, Neuromuscular re-education, Balance training, Gait training, Patient/Family education, Self Care, Joint mobilization, Spinal mobilization, Moist heat, Taping, and Manual therapy, dry needling.   PLAN FOR NEXT SESSION: See clinical impression for plan     GOALS: Goals reviewed with patient? Yes  SHORT TERM GOALS: Target date: 07/08/2023    Pt will demo IND with HEP                    Baseline: Not IND            Goal status: INITIAL   LONG TERM GOALS: Target date: 08/19/2023    1.Pt will demo proper deep core coordination  without chest breathing and optimal excursion of diaphragm/pelvic floor in order to promote spinal stability and pelvic floor function  Baseline: dyscoordination Goal status: INITIAL  2.  Pt will demo > 5 pt change on FOTO  to improve QOL and function  PFDI Urinary baseline - 17 Lower score = better function  Urinary Problem baseline-  56 Higher score = better function  Prolapse baseline - 8 Lower score = better function  Lumber baseline  -52  Higher score = better function   Goal status: INITIAL  3.  Pt will demo proper body mechanics in against gravity tasks and ADLs  work tasks, fitness  to minimize straining pelvic floor / back    Baseline: not IND, improper form that places strain on pelvic floor  Goal status: INITIAL    4. Pt will demo increased gait speed > 1.3 m/s with reciprocal gait pattern, longer stride length  in order to ambulate safely in community and return to fitness routine  Baseline:   Goal status: INITIAL    5. Pt reports decreased urge incontinence to <1 a month  in order improve hygiene and continence  Baseline:  urge incontinence 2 x month  Goal status: INITIAL   6. Pt will no longer lean forward for emptying bladder and demo proper coordination of pelvic floor for lengthening and contraction with deep core coordination   Baseline:  Pt leans forward to pee and complete emptying  Goal status: INITIAL  7.  Pt will report cook and stand in line for > 30 min without radiating pain down the L knee when walking  Baseline:  Radiating pain on L hip to lateral L knee  Occurs with standing, walking > 15 min   Goal status: INITIAL       Mariane Masters, PT 06/17/2023, 11:14 AM

## 2023-06-17 NOTE — Patient Instructions (Signed)
In morning and night    Lengthen Back rib by L  shoulder ( winging)    Lie on R  side , pillow between knees and under head  Pull  L arm overhead over mattress, grab the edge of mattress,pull it upward, drawing elbow away from ears  Breathing 10 reps  Brushing arm with 3/4 turn onto pillow behind back  Lying on R  side ,Pillow/ Block between knees     dragging top forearm across ribs below breast rotating 3/4 turn,  rotating  _L_ only this week ,  relax onto the pillow behind the back  and then back to other palm , maintain top palm on body whole top and not lift shoulder 10 reps  ONLY ON L this week, eventually do on both sides  ___   Proper body mechanics with getting out of a chair to decrease strain  on back &pelvic floor   Avoid holding your breath when Getting out of the chair:  Scoot to front part of chair chair Heels behind knees, feet are hip width apart, nose over toes  Inhale like you are smelling roses Exhale to stand     Wear shoe lift in R shoe

## 2023-06-24 ENCOUNTER — Ambulatory Visit: Payer: Medicare Other | Admitting: Physical Therapy

## 2023-06-24 DIAGNOSIS — R2689 Other abnormalities of gait and mobility: Secondary | ICD-10-CM

## 2023-06-24 DIAGNOSIS — M5459 Other low back pain: Secondary | ICD-10-CM

## 2023-06-24 DIAGNOSIS — M533 Sacrococcygeal disorders, not elsewhere classified: Secondary | ICD-10-CM

## 2023-06-24 DIAGNOSIS — G8929 Other chronic pain: Secondary | ICD-10-CM

## 2023-06-24 NOTE — Patient Instructions (Addendum)
  Lengthen Back rib by L  shoulder ( winging)    Lie on R  side , pillow between knees and under head  Pull  L arm overhead over mattress, grab the edge of mattress,pull it upward, drawing elbow away from ears  Breathing 10 reps  Brushing arm with 3/4 turn onto pillow behind back  Lying on R  side ,Pillow/ Block between knees     dragging top forearm across ribs below breast rotating 3/4 turn,  rotating  _L_ only this week ,  relax onto the pillow behind the back  and then back to other palm , maintain top palm on body whole top and not lift shoulder  Do this side this week       Wait do both sides until we have levelled out your spine and shoulders ___   Lengthen Back rib by R  shoulder   ( winging)    Lie on L  side , pillow between knees and under head  Pull  R arm overhead over mattress, grab the edge of mattress,pull it upward, drawing elbow away from ears  Breathing 10 reps  Brushing arm with 3/4 turn onto pillow behind back  Lying on L  side ,Pillow/ Block between knees     dragging top forearm across ribs below breast rotating 3/4 turn,  rotating  _R_ only this week ,  relax onto the pillow behind the back  and then back to other palm , maintain top palm on body whole top and not lift shoulder  Do this side this week       Wait do both sides until we have levelled out your spine and shoulders   __   Neck / shoulder stretches:    Lying on back - small sushi roll towel under neck  _ 6 directions   Chin up, down Rotation like changing lanes when driving Ear to shoulder like puppy dog   5 reps    _angel wings, lower elbows down , keep arms touching bed  10 reps

## 2023-06-24 NOTE — Therapy (Addendum)
OUTPATIENT PHYSICAL THERAPY TREATMENT    Patient Name: Tracy Rodgers MRN: [id number] DOB:October 31, 1945, 77 y.o., female Today's Date: 06/24/2023   PT End of Session - 06/24/23 1102     Visit Number 3    Number of Visits 10    Date for PT Re-Evaluation 08/19/23    PT Start Time 1100    PT Stop Time 1145    PT Time Calculation (min) 45 min    Activity Tolerance Patient tolerated treatment well;No increased pain    Behavior During Therapy WFL for tasks assessed/performed             Past Medical History:  Diagnosis Date   Achilles tendinitis    Allergic rhinitis    Arthritis    Cystocele, unspecified (CODE)    Diverticulosis    Dyspareunia, female    Elevated cholesterol    GERD (gastroesophageal reflux disease)    History of heavy periods    HLD (hyperlipidemia)    Impaired glucose tolerance    Melanoma (HCC)    on her back   Melanoma in situ of back (HCC)    Osteopenia    Painful menstrual periods    Rectocele    Urticaria    Uterine prolapse    Vitamin D deficiency    Past Surgical History:  Procedure Laterality Date   APPENDECTOMY     BREAST BIOPSY Left 1991   neg   BREAST EXCISIONAL BIOPSY Left 1991   broken nose     COLONOSCOPY     COLONOSCOPY N/A 12/30/2020   Procedure: COLONOSCOPY;  Surgeon: Regis Bill, MD;  Location: [facility] ENDOSCOPY;  Service: Endoscopy;  Laterality: N/A;  Patient says she needs Scolpamine patch after anesthesia for nausea.   SEPTOPLASTY     SKIN CANCER EXCISION     Patient Active Problem List   Diagnosis Date Noted   History of melanoma excision 07/21/2016   Melanosis of vulva 07/21/2016   Rectocele 07/21/2016   Midline cystocele 07/21/2016   Left knee pain 12/23/2015   History of motion sickness 09/20/2014   Allergic rhinitis 09/13/2014   Gastroesophageal reflux 09/13/2014   Hyperlipidemia, unspecified 09/13/2014   Impaired glucose tolerance 09/13/2014   Vaginal atrophy 09/13/2014   Vitamin D deficiency  09/13/2014   History of adenomatous polyp of colon 08/14/2014    PCP: Nemiah Commander  REFERRING PROVIDER: Renita Papa DIAG: Uterine prolapse without mention of vaginal wall prolapse  Rationale for Evaluation and Treatment Rehabilitation  THERAPY DIAG:  Other abnormalities of gait and mobility  Chronic pain of both knees  Other low back pain  Sacrococcygeal disorders, not elsewhere classified  ONSET DATE:   SUBJECTIVE:        SUBJECTIVE STATEMENT TODAY:    Pt received shots for her back pain this past Monday.  Pt has been wearing her shoe lift without an increased pain      Since wearing the shoe lift for the past 2 weeks, pt also notices she does not get the R groin pain.   Pt noticed she had only had one incident of leakage per week across the 2 weeks. Prior to PT, pt would experience 3 episodes of leakage per week.  Pt feels she does not notice when she needs to go, she goes every 2 hours.   Pt is still having to lean forward to finish peeing.    SUBJECTIVE STATEMENT on EVAL 06/10/23 :  1) difficulty emptying bladder / leakage :  Pt leans forward  to pees again . This started 4-5 years ago. Pt has been wearing a pessary for 5 years which helped with emptying her bladder.  Currently pt gets her pessary checked every 6 months with Dr. Dalbert Garnet. Denied straining with BMs. Pt has complete movements.  Leakage is occurring all the time. Pt changes Poise pads once a day.    2) CLBP :  at worst 8-10/10 occurs with strenuous activities, gardening, when getting out of bed, Pt has a broken tailbone 20+ years ago.   Radiating pain on L hip to lateral L knee  Occurs with standing, walking > 15 min. It does not happen with gardening/  swimming.      3) R groin pain started 2 months ago:  started as a pinching at 9/10  that comes randomly when walking, standing, stepping   and it goes away with sitting and eases 3-5 min. Does not radiate.    4) B knee pain,: L knee pain is worst  than R  level 8/10 . OA . Completed PT for L patella that was out of alignment. It started hurting again 2 years ago. R knee started to hurt. Pt was told collagen is lacking  . Completed PT for R knee as well. PT gave up on her and was recommended on knee replacement. Pt saw a second / third opinion which advised her to TKA is not needed at this time.     PERTINENT HISTORY:  OA on B knees , no prolapse surgeries,  appendectomy, 3 vaginal deliveries,  Hx of fall and fell on her nose 20 years ago and also had a fall on her tailbone before then   Fitness:  pool exercise in her personal pool ( running, riding bike, jumping jacks, PT HEP for her back   PAIN:  Are you having pain? Yes: see above  PRECAUTIONS: None  WEIGHT BEARING RESTRICTIONS: No  FALLS:  Has patient fallen in last 6 months? No   LIVING ENVIRONMENT: Lives with: lives with their spouse Lives in: house  Stairs: stoop STE , one story home   OCCUPATION: retired from Administrator, sports,  Current hobbies: gardening, travel ,   PLOF: Independent  PATIENT GOALS:  Not pee in her pants, not go to the bathroom so often    OBJECTIVE:      [facility] PT Assessment - 06/24/23 1139       Strength   Overall Strength Comments R hip flexion  4-/5, L 5/5      Palpation   Palpation comment Deviated C5-6 to R, medial scapula mm tightness, upper trap, SCM, scalenes, R             [facility] Adult PT Treatment/Exercise - 06/24/23 1142       Neuro Re-ed    Neuro Re-ed Details  cued for HEP to promote cervical and scapular mobility      Modalities   Modalities Moist Heat      Moist Heat Therapy   Moist Heat Location --   unbilled, cervical     Manual Therapy   Manual therapy comments STM/MWM at problem areas noted in assessment to realign deviated C 5-6 and improve scapular mobility R                HOME EXERCISE PROGRAM: See pt instruction section    ASSESSMENT:  CLINICAL IMPRESSION:    Improvements:  Since wearing the shoe lift for the past 2 weeks, pt also notices she does not  get the R groin pain.   Pt noticed she had only had one incident of leakage per week across the 2 weeks. Prior to PT, pt would experience 3 episodes of leakage per week.  Pt feels she does not notice when she needs to go, she goes every 2 hours.    Continue to realignment of upper spine to level shoulder and mobilize cervical/ scapular mobility today with manual Tx. Pt reported feeling relaxed post Tx and showed more improvement in these area.   As pt decreasing in upper trap tightness and regains cervical/ thoracic alignment, pt will be ready to learn deep core HEP which will help with LBP and pelvic issues.   Plan to address limited diaphragmatic excursion  next session . Plan to assess her self-selected exercises she does in the pool to ensure they are not contributing to radiating LBP. Plan to review body mechanics for gardening to address LBP.    Pt benefits from skilled PT.    OBJECTIVE IMPAIRMENTS decreased activity tolerance, decreased coordination, decreased endurance, decreased mobility, difficulty walking, decreased ROM, decreased strength, decreased safety awareness, hypomobility, increased muscle spasms, impaired flexibility, improper body mechanics, postural dysfunction, and pain. scar restrictions   ACTIVITY LIMITATIONS  self-care,  sleep, home chores, work tasks    PARTICIPATION LIMITATIONS:  community, gym activities    PERSONAL FACTORS        are also affecting patient's functional outcome.    REHAB POTENTIAL: Good   CLINICAL DECISION MAKING: Evolving/moderate complexity   EVALUATION COMPLEXITY: Moderate    PATIENT EDUCATION:    Education details: Showed pt anatomy images. Explained muscles attachments/ connection, physiology of deep core system/ spinal- thoracic-pelvis-lower kinetic chain as they relate to pt's presentation, Sx, and past Hx. Explained what and how these  areas of deficits need to be restored to balance and function    See Therapeutic activity / neuromuscular re-education section  Answered pt's questions.   Person educated: Patient Education method: Explanation, Demonstration, Tactile cues, Verbal cues, and Handouts Education comprehension: verbalized understanding, returned demonstration, verbal cues required, tactile cues required, and needs further education     PLAN: PT FREQUENCY: 1x/week   PT DURATION: 10 weeks   PLANNED INTERVENTIONS: Therapeutic exercises, Therapeutic activity, Neuromuscular re-education, Balance training, Gait training, Patient/Family education, Self Care, Joint mobilization, Spinal mobilization, Moist heat, Taping, and Manual therapy, dry needling.   PLAN FOR NEXT SESSION: See clinical impression for plan     GOALS: Goals reviewed with patient? Yes  SHORT TERM GOALS: Target date: 07/08/2023    Pt will demo IND with HEP                    Baseline: Not IND            Goal status: INITIAL   LONG TERM GOALS: Target date: 08/19/2023    1.Pt will demo proper deep core coordination without chest breathing and optimal excursion of diaphragm/pelvic floor in order to promote spinal stability and pelvic floor function  Baseline: dyscoordination Goal status: INITIAL  2.  Pt will demo > 5 pt change on FOTO  to improve QOL and function  PFDI Urinary baseline - 17 Lower score = better function  Urinary Problem baseline-  56 Higher score = better function  Prolapse baseline - 8 Lower score = better function  Lumber baseline  -52  Higher score = better function   Goal status: INITIAL  3.  Pt will demo proper body mechanics in against gravity tasks  and ADLs  work tasks, fitness  to minimize straining pelvic floor / back    Baseline: not IND, improper form that places strain on pelvic floor  Goal status: INITIAL    4. Pt will demo increased gait speed > 1.3 m/s with reciprocal gait pattern,  longer stride length  in order to ambulate safely in community and return to fitness routine  Baseline:   Goal status: INITIAL    5. Pt reports decreased urge incontinence to <1 a month  in order improve hygiene and continence  Baseline:  urge incontinence 2 x month  Goal status: INITIAL   6. Pt will no longer lean forward for emptying bladder and demo proper coordination of pelvic floor for lengthening and contraction with deep core coordination   Baseline:  Pt leans forward to pee and complete emptying  Goal status: INITIAL  7.  Pt will report cook and stand in line for > 30 min without radiating pain down the L knee when walking  Baseline:  Radiating pain on L hip to lateral L knee  Occurs with standing, walking > 15 min   Goal status: INITIAL       Mariane Masters, PT 06/24/2023, 11:03 AM

## 2023-06-25 ENCOUNTER — Encounter: Payer: Self-pay | Admitting: Physical Therapy

## 2023-07-01 ENCOUNTER — Encounter: Payer: Medicare Other | Admitting: Physical Therapy

## 2023-07-01 ENCOUNTER — Ambulatory Visit: Payer: Medicare Other | Admitting: Physical Therapy

## 2023-07-01 DIAGNOSIS — M5459 Other low back pain: Secondary | ICD-10-CM

## 2023-07-01 DIAGNOSIS — R2689 Other abnormalities of gait and mobility: Secondary | ICD-10-CM | POA: Diagnosis not present

## 2023-07-01 DIAGNOSIS — G8929 Other chronic pain: Secondary | ICD-10-CM

## 2023-07-01 NOTE — Therapy (Signed)
OUTPATIENT PHYSICAL THERAPY TREATMENT    Patient Name: Tracy Rodgers MRN: [id number] DOB:Feb 16, 1946, 77 y.o., female Today's Date: 07/01/2023   PT End of Session - 07/01/23 1323     Visit Number 4    Number of Visits 10    Date for PT Re-Evaluation 08/19/23    PT Start Time 1104    PT Stop Time 1150    PT Time Calculation (min) 46 min    Activity Tolerance Patient tolerated treatment well;No increased pain    Behavior During Therapy WFL for tasks assessed/performed             Past Medical History:  Diagnosis Date   Achilles tendinitis    Allergic rhinitis    Arthritis    Cystocele, unspecified (CODE)    Diverticulosis    Dyspareunia, female    Elevated cholesterol    GERD (gastroesophageal reflux disease)    History of heavy periods    HLD (hyperlipidemia)    Impaired glucose tolerance    Melanoma (HCC)    on her back   Melanoma in situ of back (HCC)    Osteopenia    Painful menstrual periods    Rectocele    Urticaria    Uterine prolapse    Vitamin D deficiency    Past Surgical History:  Procedure Laterality Date   APPENDECTOMY     BREAST BIOPSY Left 1991   neg   BREAST EXCISIONAL BIOPSY Left 1991   broken nose     COLONOSCOPY     COLONOSCOPY N/A 12/30/2020   Procedure: COLONOSCOPY;  Surgeon: Regis Bill, MD;  Location: [facility] ENDOSCOPY;  Service: Endoscopy;  Laterality: N/A;  Patient says she needs Scolpamine patch after anesthesia for nausea.   SEPTOPLASTY     SKIN CANCER EXCISION     Patient Active Problem List   Diagnosis Date Noted   History of melanoma excision 07/21/2016   Melanosis of vulva 07/21/2016   Rectocele 07/21/2016   Midline cystocele 07/21/2016   Left knee pain 12/23/2015   History of motion sickness 09/20/2014   Allergic rhinitis 09/13/2014   Gastroesophageal reflux 09/13/2014   Hyperlipidemia, unspecified 09/13/2014   Impaired glucose tolerance 09/13/2014   Vaginal atrophy 09/13/2014   Vitamin D deficiency  09/13/2014   History of adenomatous polyp of colon 08/14/2014    PCP: Nemiah Commander  REFERRING PROVIDER: Renita Papa DIAG: Uterine prolapse without mention of vaginal wall prolapse  Rationale for Evaluation and Treatment Rehabilitation  THERAPY DIAG:  Other abnormalities of gait and mobility  Chronic pain of both knees  Other low back pain  ONSET DATE:   SUBJECTIVE:        SUBJECTIVE STATEMENT TODAY:    Pt had question about the neck stretches . Pt leaves for her cruise tomorrow after she drives to [place].    SUBJECTIVE STATEMENT on EVAL 06/10/23 :  1) difficulty emptying bladder / leakage :  Pt leans forward to pees again . This started 4-5 years ago. Pt has been wearing a pessary for 5 years which helped with emptying her bladder.  Currently pt gets her pessary checked every 6 months with Dr. Dalbert Garnet. Denied straining with BMs. Pt has complete movements.  Leakage is occurring all the time. Pt changes Poise pads once a day.    2) CLBP :  at worst 8-10/10 occurs with strenuous activities, gardening, when getting out of bed, Pt has a broken tailbone 20+ years ago.   Radiating pain on L hip  to lateral L knee  Occurs with standing, walking > 15 min. It does not happen with gardening/  swimming.      3) R groin pain started 2 months ago:  started as a pinching at 9/10  that comes randomly when walking, standing, stepping   and it goes away with sitting and eases 3-5 min. Does not radiate.    4) B knee pain,: L knee pain is worst than R  level 8/10 . OA . Completed PT for L patella that was out of alignment. It started hurting again 2 years ago. R knee started to hurt. Pt was told collagen is lacking  . Completed PT for R knee as well. PT gave up on her and was recommended on knee replacement. Pt saw a second / third opinion which advised her to TKA is not needed at this time.     PERTINENT HISTORY:  OA on B knees , no prolapse surgeries,  appendectomy, 3 vaginal  deliveries,  Hx of fall and fell on her nose 20 years ago and also had a fall on her tailbone before then   Fitness:  pool exercise in her personal pool ( running, riding bike, jumping jacks, PT HEP for her back   PAIN:  Are you having pain? Yes: see above  PRECAUTIONS: None  WEIGHT BEARING RESTRICTIONS: No  FALLS:  Has patient fallen in last 6 months? No   LIVING ENVIRONMENT: Lives with: lives with their spouse Lives in: house  Stairs: stoop STE , one story home   OCCUPATION: retired from Administrator, sports,  Current hobbies: gardening, travel ,   PLOF: Independent  PATIENT GOALS:  Not pee in her pants, not go to the bathroom so often    OBJECTIVE:    [facility] PT Assessment - 07/01/23 1323       Coordination   Coordination and Movement Description ab overuse with deep core      Palpation   Palpation comment less deviated C5-6, tightness of scalenes, upper trap,medial scapula mm attachments, paraspinals B, occiputal mm attachments               [facility] Adult PT Treatment/Exercise - 07/01/23 1326       Neuro Re-ed    Neuro Re-ed Details  cued for neck HEP, log rolling, deep core training      Modalities   Modalities Moist Heat      Moist Heat Therapy   Number Minutes Moist Heat 4 Minutes      Manual Therapy   Manual therapy comments STM/MWM at problem areas noted in assessment to improve mobility and minimize forward head posture              HOME EXERCISE PROGRAM: See pt instruction section    ASSESSMENT:  CLINICAL IMPRESSION:    Continued to apply manual Tx to realign upper spine to level shoulder and mobilize cervical/ scapular mobility today with manual Tx. Pt reported feeling relaxed post Tx and showed more improvement in these area, more levelled shoulders.    Progressed to deep core training with cues required to minimize ab overuse .   Plan to assess her self-selected exercises she does in the pool to ensure they are not  contributing to radiating LBP. Plan to review body mechanics for gardening to address LBP.    Pt benefits from skilled PT.    OBJECTIVE IMPAIRMENTS decreased activity tolerance, decreased coordination, decreased endurance, decreased mobility, difficulty walking, decreased ROM, decreased strength, decreased  safety awareness, hypomobility, increased muscle spasms, impaired flexibility, improper body mechanics, postural dysfunction, and pain. scar restrictions   ACTIVITY LIMITATIONS  self-care,  sleep, home chores, work tasks    PARTICIPATION LIMITATIONS:  community, gym activities    PERSONAL FACTORS        are also affecting patient's functional outcome.    REHAB POTENTIAL: Good   CLINICAL DECISION MAKING: Evolving/moderate complexity   EVALUATION COMPLEXITY: Moderate    PATIENT EDUCATION:    Education details: Showed pt anatomy images. Explained muscles attachments/ connection, physiology of deep core system/ spinal- thoracic-pelvis-lower kinetic chain as they relate to pt's presentation, Sx, and past Hx. Explained what and how these areas of deficits need to be restored to balance and function    See Therapeutic activity / neuromuscular re-education section  Answered pt's questions.   Person educated: Patient Education method: Explanation, Demonstration, Tactile cues, Verbal cues, and Handouts Education comprehension: verbalized understanding, returned demonstration, verbal cues required, tactile cues required, and needs further education     PLAN: PT FREQUENCY: 1x/week   PT DURATION: 10 weeks   PLANNED INTERVENTIONS: Therapeutic exercises, Therapeutic activity, Neuromuscular re-education, Balance training, Gait training, Patient/Family education, Self Care, Joint mobilization, Spinal mobilization, Moist heat, Taping, and Manual therapy, dry needling.   PLAN FOR NEXT SESSION: See clinical impression for plan     GOALS: Goals reviewed with patient? Yes  SHORT TERM  GOALS: Target date: 07/08/2023    Pt will demo IND with HEP                    Baseline: Not IND            Goal status: INITIAL   LONG TERM GOALS: Target date: 08/19/2023    1.Pt will demo proper deep core coordination without chest breathing and optimal excursion of diaphragm/pelvic floor in order to promote spinal stability and pelvic floor function  Baseline: dyscoordination Goal status: INITIAL  2.  Pt will demo > 5 pt change on FOTO  to improve QOL and function  PFDI Urinary baseline - 17 Lower score = better function  Urinary Problem baseline-  56 Higher score = better function  Prolapse baseline - 8 Lower score = better function  Lumber baseline  -52  Higher score = better function   Goal status: INITIAL  3.  Pt will demo proper body mechanics in against gravity tasks and ADLs  work tasks, fitness  to minimize straining pelvic floor / back    Baseline: not IND, improper form that places strain on pelvic floor  Goal status: INITIAL    4. Pt will demo increased gait speed > 1.3 m/s with reciprocal gait pattern, longer stride length  in order to ambulate safely in community and return to fitness routine  Baseline:   Goal status: INITIAL    5. Pt reports decreased urge incontinence to <1 a month  in order improve hygiene and continence  Baseline:  urge incontinence 2 x month  Goal status: INITIAL   6. Pt will no longer lean forward for emptying bladder and demo proper coordination of pelvic floor for lengthening and contraction with deep core coordination   Baseline:  Pt leans forward to pee and complete emptying  Goal status: INITIAL  7.  Pt will report cook and stand in line for > 30 min without radiating pain down the L knee when walking  Baseline:  Radiating pain on L hip to lateral L knee  Occurs with standing, walking >  15 min   Goal status: INITIAL       Mariane Masters, PT 07/01/2023, 1:30 PM

## 2023-07-15 ENCOUNTER — Ambulatory Visit: Payer: Medicare Other | Attending: Obstetrics and Gynecology | Admitting: Physical Therapy

## 2023-07-15 DIAGNOSIS — M5459 Other low back pain: Secondary | ICD-10-CM | POA: Insufficient documentation

## 2023-07-15 DIAGNOSIS — M25562 Pain in left knee: Secondary | ICD-10-CM | POA: Diagnosis present

## 2023-07-15 DIAGNOSIS — M533 Sacrococcygeal disorders, not elsewhere classified: Secondary | ICD-10-CM | POA: Insufficient documentation

## 2023-07-15 DIAGNOSIS — R2689 Other abnormalities of gait and mobility: Secondary | ICD-10-CM | POA: Insufficient documentation

## 2023-07-15 DIAGNOSIS — M25561 Pain in right knee: Secondary | ICD-10-CM | POA: Diagnosis present

## 2023-07-15 DIAGNOSIS — G8929 Other chronic pain: Secondary | ICD-10-CM | POA: Diagnosis present

## 2023-07-15 NOTE — Therapy (Signed)
OUTPATIENT PHYSICAL THERAPY TREATMENT    Patient Name: Tracy Rodgers MRN: [id number] DOB:January 21, 1946, 77 y.o., female Today's Date: 07/15/2023   PT End of Session - 07/15/23 1512     Visit Number 5    Number of Visits 10    Date for PT Re-Evaluation 08/19/23    PT Start Time 1500    PT Stop Time 1545    PT Time Calculation (min) 45 min    Activity Tolerance Patient tolerated treatment well;No increased pain    Behavior During Therapy WFL for tasks assessed/performed             Past Medical History:  Diagnosis Date   Achilles tendinitis    Allergic rhinitis    Arthritis    Cystocele, unspecified (CODE)    Diverticulosis    Dyspareunia, female    Elevated cholesterol    GERD (gastroesophageal reflux disease)    History of heavy periods    HLD (hyperlipidemia)    Impaired glucose tolerance    Melanoma (HCC)    on her back   Melanoma in situ of back (HCC)    Osteopenia    Painful menstrual periods    Rectocele    Urticaria    Uterine prolapse    Vitamin D deficiency    Past Surgical History:  Procedure Laterality Date   APPENDECTOMY     BREAST BIOPSY Left 1991   neg   BREAST EXCISIONAL BIOPSY Left 1991   broken nose     COLONOSCOPY     COLONOSCOPY N/A 12/30/2020   Procedure: COLONOSCOPY;  Surgeon: Regis Bill, MD;  Location: [facility];  Service: Endoscopy;  Laterality: N/A;  Patient says she needs Scolpamine patch after anesthesia for nausea.   SEPTOPLASTY     SKIN CANCER EXCISION     Patient Active Problem List   Diagnosis Date Noted   History of melanoma excision 07/21/2016   Melanosis of vulva 07/21/2016   Rectocele 07/21/2016   Midline cystocele 07/21/2016   Left knee pain 12/23/2015   History of motion sickness 09/20/2014   Allergic rhinitis 09/13/2014   Gastroesophageal reflux 09/13/2014   Hyperlipidemia, unspecified 09/13/2014   Impaired glucose tolerance 09/13/2014   Vaginal atrophy 09/13/2014   Vitamin D deficiency  09/13/2014   History of adenomatous polyp of colon 08/14/2014    PCP: Nemiah Commander  REFERRING PROVIDER: Renita Papa DIAG: Uterine prolapse without mention of vaginal wall prolapse  Rationale for Evaluation and Treatment Rehabilitation  THERAPY DIAG:  Other abnormalities of gait and mobility  Chronic pain of both knees  Other low back pain  Sacrococcygeal disorders, not elsewhere classified  ONSET DATE:   SUBJECTIVE:        SUBJECTIVE STATEMENT TODAY:   Pt was able to walk on her cruise ship a lot without radiating pain. Pt has radiating LBP today after cleaning.    Pt had had good voids and she is not having to lean as much to empty completely.   SUBJECTIVE STATEMENT on EVAL 06/10/23 :  1) difficulty emptying bladder / leakage :  Pt leans forward to pees again . This started 4-5 years ago. Pt has been wearing a pessary for 5 years which helped with emptying her bladder.  Currently pt gets her pessary checked every 6 months with Dr. Dalbert Garnet. Denied straining with BMs. Pt has complete movements.  Leakage is occurring all the time. Pt changes Poise pads once a day.    2) CLBP :  at worst  8-10/10 occurs with strenuous activities, gardening, when getting out of bed, Pt has a broken tailbone 20+ years ago.   Radiating pain on L hip to lateral L knee  Occurs with standing, walking > 15 min. It does not happen with gardening/  swimming.      3) R groin pain started 2 months ago:  started as a pinching at 9/10  that comes randomly when walking, standing, stepping   and it goes away with sitting and eases 3-5 min. Does not radiate.    4) B knee pain,: L knee pain is worst than R  level 8/10 . OA . Completed PT for L patella that was out of alignment. It started hurting again 2 years ago. R knee started to hurt. Pt was told collagen is lacking  . Completed PT for R knee as well. PT gave up on her and was recommended on knee replacement. Pt saw a second / third opinion which  advised her to TKA is not needed at this time.     PERTINENT HISTORY:  OA on B knees , no prolapse surgeries,  appendectomy, 3 vaginal deliveries,  Hx of fall and fell on her nose 20 years ago and also had a fall on her tailbone before then   Fitness:  pool exercise in her personal pool ( running, riding bike, jumping jacks, PT HEP for her back   PAIN:  Are you having pain? Yes: see above  PRECAUTIONS: None  WEIGHT BEARING RESTRICTIONS: No  FALLS:  Has patient fallen in last 6 months? No   LIVING ENVIRONMENT: Lives with: lives with their spouse Lives in: house  Stairs: stoop STE , one story home   OCCUPATION: retired from Administrator, sports,  Current hobbies: gardening, travel ,   PLOF: Independent  PATIENT GOALS:  Not pee in her pants, not go to the bathroom so often    OBJECTIVE:    [facility] PT Assessment - 07/15/23 1542       Palpation   Palpation comment tightness along L IT band, hamstring, adductor , GH joint    Observation: adducted knees in deep core level 1-2             [facility] Adult PT Treatment/Exercise - 07/15/23 1539       Neuro Re-ed    Neuro Re-ed Details  cued for feet propioception with deep core level 1-2 to minimize adducted knees,      Manual Therapy   Manual therapy comments STM/MWM at problem areas noted in assessment               HOME EXERCISE PROGRAM: See pt instruction section    ASSESSMENT:  CLINICAL IMPRESSION:   Improvements:  Pt was able to walk on her cruise ship a lot without radiating pain. Pt has radiating LBP today after cleaning.    Pt had had good voids and she is not having to lean as much to empty completely.    Addressed tightness and hypomobility at L SIJ, glut, Lateral thigh, knee with manual Tx. Pt demo'd increased mobility. Pt required cues for feet propioception with deep core level 1-2 to minimize adducted knees which will help with knee issues.    Plan to assess her self-selected  exercises she does in the pool to ensure they are not contributing to radiating LBP. Plan to review body mechanics for gardening to address LBP.    Pt benefits from skilled PT.    OBJECTIVE IMPAIRMENTS decreased activity  tolerance, decreased coordination, decreased endurance, decreased mobility, difficulty walking, decreased ROM, decreased strength, decreased safety awareness, hypomobility, increased muscle spasms, impaired flexibility, improper body mechanics, postural dysfunction, and pain. scar restrictions   ACTIVITY LIMITATIONS  self-care,  sleep, home chores, work tasks    PARTICIPATION LIMITATIONS:  community, gym activities    PERSONAL FACTORS        are also affecting patient's functional outcome.    REHAB POTENTIAL: Good   CLINICAL DECISION MAKING: Evolving/moderate complexity   EVALUATION COMPLEXITY: Moderate    PATIENT EDUCATION:    Education details: Showed pt anatomy images. Explained muscles attachments/ connection, physiology of deep core system/ spinal- thoracic-pelvis-lower kinetic chain as they relate to pt's presentation, Sx, and past Hx. Explained what and how these areas of deficits need to be restored to balance and function    See Therapeutic activity / neuromuscular re-education section  Answered pt's questions.   Person educated: Patient Education method: Explanation, Demonstration, Tactile cues, Verbal cues, and Handouts Education comprehension: verbalized understanding, returned demonstration, verbal cues required, tactile cues required, and needs further education     PLAN: PT FREQUENCY: 1x/week   PT DURATION: 10 weeks   PLANNED INTERVENTIONS: Therapeutic exercises, Therapeutic activity, Neuromuscular re-education, Balance training, Gait training, Patient/Family education, Self Care, Joint mobilization, Spinal mobilization, Moist heat, Taping, and Manual therapy, dry needling.   PLAN FOR NEXT SESSION: See clinical impression for plan      GOALS: Goals reviewed with patient? Yes  SHORT TERM GOALS: Target date: 07/08/2023    Pt will demo IND with HEP                    Baseline: Not IND            Goal status: INITIAL   LONG TERM GOALS: Target date: 08/19/2023    1.Pt will demo proper deep core coordination without chest breathing and optimal excursion of diaphragm/pelvic floor in order to promote spinal stability and pelvic floor function  Baseline: dyscoordination Goal status: INITIAL  2.  Pt will demo > 5 pt change on FOTO  to improve QOL and function  PFDI Urinary baseline - 17 Lower score = better function  Urinary Problem baseline-  56 Higher score = better function  Prolapse baseline - 8 Lower score = better function  Lumber baseline  -52  Higher score = better function   Goal status: INITIAL  3.  Pt will demo proper body mechanics in against gravity tasks and ADLs  work tasks, fitness  to minimize straining pelvic floor / back    Baseline: not IND, improper form that places strain on pelvic floor  Goal status: MET     4. Pt will demo increased gait speed > 1.3 m/s with reciprocal gait pattern, longer stride length  in order to ambulate safely in community and return to fitness routine  Baseline:   Goal status: INITIAL    5. Pt reports decreased urge incontinence to <1 a month  in order improve hygiene and continence  Baseline:  urge incontinence 2 x month  Goal status: Partially Met   ( 07/15/23: no continence for two weeks)    6. Pt will no longer lean forward for emptying bladder and demo proper coordination of pelvic floor for lengthening and contraction with deep core coordination   Baseline:  Pt leans forward to pee and complete emptying  Goal status:   7.  Pt will report cook and stand in line for > 30 min  without radiating pain down the L knee when walking  Baseline:  Radiating pain on L hip to lateral L knee  Occurs with  Ongoing      Mariane Masters,  PT 07/15/2023, 3:43 PM

## 2023-07-22 ENCOUNTER — Ambulatory Visit: Payer: Medicare Other | Admitting: Physical Therapy

## 2023-07-22 DIAGNOSIS — R2689 Other abnormalities of gait and mobility: Secondary | ICD-10-CM | POA: Diagnosis not present

## 2023-07-22 DIAGNOSIS — M5459 Other low back pain: Secondary | ICD-10-CM

## 2023-07-22 DIAGNOSIS — G8929 Other chronic pain: Secondary | ICD-10-CM

## 2023-07-22 DIAGNOSIS — M533 Sacrococcygeal disorders, not elsewhere classified: Secondary | ICD-10-CM

## 2023-07-22 NOTE — Patient Instructions (Addendum)
Deep core level 1-2 (handout)  2 x day   [facility] with higher knees and pushing off toes   _    Clam Shell 45 Degrees  Lying with hips and knees bent 45, one pillow between knees and ankles. Heel together, toes apart like ballerina,  Lift knee with exhale while pressing heels together. Be sure pelvis does not roll backward. Do not arch back. Do 20 times, each leg, 2 times per day.    Complimentary stretch:   Sit at 45 deg turn with R leg and knee on edge of chair/ bench, L buttock hanging off the edge to bring the L foot back like a lunge, toes bent, lower heel to feel quad stretch,  pay attention to keeping pinky and first toe ballmound planted to align toes forward so ankles are not twerked   Repeat with other side  10 rocks  ___

## 2023-07-22 NOTE — Therapy (Signed)
OUTPATIENT PHYSICAL THERAPY TREATMENT    Patient Name: Tracy Rodgers MRN: [id number] DOB:03-Feb-1946, 77 y.o., female Today's Date: 07/22/2023   PT End of Session - 07/22/23 1510     Visit Number 6    Number of Visits 10    Date for PT Re-Evaluation 08/19/23    PT Start Time 1503    PT Stop Time 1545    PT Time Calculation (min) 42 min    Activity Tolerance Patient tolerated treatment well;No increased pain    Behavior During Therapy WFL for tasks assessed/performed             Past Medical History:  Diagnosis Date   Achilles tendinitis    Allergic rhinitis    Arthritis    Cystocele, unspecified (CODE)    Diverticulosis    Dyspareunia, female    Elevated cholesterol    GERD (gastroesophageal reflux disease)    History of heavy periods    HLD (hyperlipidemia)    Impaired glucose tolerance    Melanoma (HCC)    on her back   Melanoma in situ of back (HCC)    Osteopenia    Painful menstrual periods    Rectocele    Urticaria    Uterine prolapse    Vitamin D deficiency    Past Surgical History:  Procedure Laterality Date   APPENDECTOMY     BREAST BIOPSY Left 1991   neg   BREAST EXCISIONAL BIOPSY Left 1991   broken nose     COLONOSCOPY     COLONOSCOPY N/A 12/30/2020   Procedure: COLONOSCOPY;  Surgeon: Regis Bill, MD;  Location: [facility];  Service: Endoscopy;  Laterality: N/A;  Patient says she needs Scolpamine patch after anesthesia for nausea.   SEPTOPLASTY     SKIN CANCER EXCISION     Patient Active Problem List   Diagnosis Date Noted   History of melanoma excision 07/21/2016   Melanosis of vulva 07/21/2016   Rectocele 07/21/2016   Midline cystocele 07/21/2016   Left knee pain 12/23/2015   History of motion sickness 09/20/2014   Allergic rhinitis 09/13/2014   Gastroesophageal reflux 09/13/2014   Hyperlipidemia, unspecified 09/13/2014   Impaired glucose tolerance 09/13/2014   Vaginal atrophy 09/13/2014   Vitamin D deficiency  09/13/2014   History of adenomatous polyp of colon 08/14/2014    PCP: Nemiah Commander  REFERRING PROVIDER: Renita Papa DIAG: Uterine prolapse without mention of vaginal wall prolapse  Rationale for Evaluation and Treatment Rehabilitation  THERAPY DIAG:  Chronic pain of both knees  Sacrococcygeal disorders, not elsewhere classified  Other low back pain  Other abnormalities of gait and mobility  ONSET DATE:   SUBJECTIVE:        SUBJECTIVE STATEMENT TODAY:  Pt is noticing she is able to make it to the bathroom before leakage. Pt is also able to pee without leaning forward as often   SUBJECTIVE STATEMENT on EVAL 06/10/23 :  1) difficulty emptying bladder / leakage :  Pt leans forward to pees again . This started 4-5 years ago. Pt has been wearing a pessary for 5 years which helped with emptying her bladder.  Currently pt gets her pessary checked every 6 months with Dr. Dalbert Garnet. Denied straining with BMs. Pt has complete movements.  Leakage is occurring all the time. Pt changes Poise pads once a day.    2) CLBP :  at worst 8-10/10 occurs with strenuous activities, gardening, when getting out of bed, Pt has a broken tailbone 20+  years ago.   Radiating pain on L hip to lateral L knee  Occurs with standing, walking > 15 min. It does not happen with gardening/  swimming.      3) R groin pain started 2 months ago:  started as a pinching at 9/10  that comes randomly when walking, standing, stepping   and it goes away with sitting and eases 3-5 min. Does not radiate.    4) B knee pain,: L knee pain is worst than R  level 8/10 . OA . Completed PT for L patella that was out of alignment. It started hurting again 2 years ago. R knee started to hurt. Pt was told collagen is lacking  . Completed PT for R knee as well. PT gave up on her and was recommended on knee replacement. Pt saw a second / third opinion which advised her to TKA is not needed at this time.     PERTINENT HISTORY:  OA on  B knees , no prolapse surgeries,  appendectomy, 3 vaginal deliveries,  Hx of fall and fell on her nose 20 years ago and also had a fall on her tailbone before then   Fitness:  pool exercise in her personal pool ( running, riding bike, jumping jacks, PT HEP for her back   PAIN:  Are you having pain? Yes: see above  PRECAUTIONS: None  WEIGHT BEARING RESTRICTIONS: No  FALLS:  Has patient fallen in last 6 months? No   LIVING ENVIRONMENT: Lives with: lives with their spouse Lives in: house  Stairs: stoop STE , one story home   OCCUPATION: retired from Administrator, sports,  Current hobbies: gardening, travel ,   PLOF: Independent  PATIENT GOALS:  Not pee in her pants, not go to the bathroom so often    OBJECTIVE:   [facility] PT Assessment - 07/23/23 0840       Observation/Other Assessments   Observations poor technqiue with clamshells, not aware of stretches to lengthen hip abduciton mm.      Coordination   Coordination and Movement Description poor carry over with technique in deep core , provided more cues for feet  / knee propicoeption      Ambulation/Gait   Gait Comments narrow BOS,  heel striking , minimal stride/  knee flexion. hip flexion             [facility] Adult PT Treatment/Exercise - 07/23/23 0840       Therapeutic Activites    Other Therapeutic Activities gait training with tactile , verbal cues      Neuro Re-ed    Neuro Re-ed Details  excessive cues for clamshell, stretches, deep core , gait training                 HOME EXERCISE PROGRAM: See pt instruction section    ASSESSMENT:  CLINICAL IMPRESSION:   Improvements this week:  Pt is noticing she is able to make it to the bathroom before leakage. Pt is also able to pee without leaning forward as often  Provided excessive cues for clamshell, stretches, deep core , gait training.  Provided gait training with tactile , verbal cues to improve longer stride length and less heelstriking  to minimize knee pain.   Pt demo'd improved tehcniques for all the above post training.   Plan to assess her self-selected exercises she does in the pool to ensure they are not contributing to radiating LBP. Plan to review body mechanics for gardening to address LBP.  Pt benefits from skilled PT.    OBJECTIVE IMPAIRMENTS decreased activity tolerance, decreased coordination, decreased endurance, decreased mobility, difficulty walking, decreased ROM, decreased strength, decreased safety awareness, hypomobility, increased muscle spasms, impaired flexibility, improper body mechanics, postural dysfunction, and pain. scar restrictions   ACTIVITY LIMITATIONS  self-care,  sleep, home chores, work tasks    PARTICIPATION LIMITATIONS:  community, gym activities    PERSONAL FACTORS        are also affecting patient's functional outcome.    REHAB POTENTIAL: Good   CLINICAL DECISION MAKING: Evolving/moderate complexity   EVALUATION COMPLEXITY: Moderate    PATIENT EDUCATION:    Education details: Showed pt anatomy images. Explained muscles attachments/ connection, physiology of deep core system/ spinal- thoracic-pelvis-lower kinetic chain as they relate to pt's presentation, Sx, and past Hx. Explained what and how these areas of deficits need to be restored to balance and function    See Therapeutic activity / neuromuscular re-education section  Answered pt's questions.   Person educated: Patient Education method: Explanation, Demonstration, Tactile cues, Verbal cues, and Handouts Education comprehension: verbalized understanding, returned demonstration, verbal cues required, tactile cues required, and needs further education     PLAN: PT FREQUENCY: 1x/week   PT DURATION: 10 weeks   PLANNED INTERVENTIONS: Therapeutic exercises, Therapeutic activity, Neuromuscular re-education, Balance training, Gait training, Patient/Family education, Self Care, Joint mobilization, Spinal mobilization,  Moist heat, Taping, and Manual therapy, dry needling.   PLAN FOR NEXT SESSION: See clinical impression for plan     GOALS: Goals reviewed with patient? Yes  SHORT TERM GOALS: Target date: 07/08/2023    Pt will demo IND with HEP                    Baseline: Not IND            Goal status: INITIAL   LONG TERM GOALS: Target date: 08/19/2023    1.Pt will demo proper deep core coordination without chest breathing and optimal excursion of diaphragm/pelvic floor in order to promote spinal stability and pelvic floor function  Baseline: dyscoordination Goal status: INITIAL  2.  Pt will demo > 5 pt change on FOTO  to improve QOL and function  PFDI Urinary baseline - 17 Lower score = better function  Urinary Problem baseline-  56 Higher score = better function  Prolapse baseline - 8 Lower score = better function  Lumber baseline  -52  Higher score = better function   Goal status: INITIAL  3.  Pt will demo proper body mechanics in against gravity tasks and ADLs  work tasks, fitness  to minimize straining pelvic floor / back    Baseline: not IND, improper form that places strain on pelvic floor  Goal status: MET     4. Pt will demo increased gait speed > 1.3 m/s with reciprocal gait pattern, longer stride length  in order to ambulate safely in community and return to fitness routine  Baseline:   Goal status: INITIAL    5. Pt reports decreased urge incontinence to <1 a month  in order improve hygiene and continence  Baseline:  urge incontinence 2 x month  Goal status: Partially Met   ( 07/15/23: no continence for two weeks)    6. Pt will no longer lean forward for emptying bladder and demo proper coordination of pelvic floor for lengthening and contraction with deep core coordination   Baseline:  Pt leans forward to pee and complete emptying  Goal status:   7.  Pt will report cook and stand in line for > 30 min without radiating pain down the L knee when  walking  Baseline:  Radiating pain on L hip to lateral L knee  Occurs with  Ongoing      Mariane Masters, PT 07/22/2023, 3:35 PM

## 2023-07-27 ENCOUNTER — Ambulatory Visit: Payer: Medicare Other | Admitting: Physical Therapy

## 2023-07-27 DIAGNOSIS — M5459 Other low back pain: Secondary | ICD-10-CM

## 2023-07-27 DIAGNOSIS — R2689 Other abnormalities of gait and mobility: Secondary | ICD-10-CM

## 2023-07-27 DIAGNOSIS — M533 Sacrococcygeal disorders, not elsewhere classified: Secondary | ICD-10-CM

## 2023-07-27 DIAGNOSIS — G8929 Other chronic pain: Secondary | ICD-10-CM

## 2023-07-27 NOTE — Patient Instructions (Signed)
Applying Pelvic tilts:   Decreasing Low back pain:  Pelvic tilts Forward, Back, and Neutral    WHILE ON YOUR BACK, KNEES BENT, FEET HIP WIDTH APART  Pelvic tilts, to avoid overtightening gluts and adductors and obliques   Not rock back so far, not tightening adductors More ballmounds contact  Thumb at ribs, index finger at ASIS to make sure to not over do  Exhale on backward rock Inhale tailbone into table   ___  Added knee to ward armpit chest stretch , circling to stretch pelvic floor

## 2023-07-27 NOTE — Therapy (Signed)
OUTPATIENT PHYSICAL THERAPY TREATMENT    Patient Name: Tracy Rodgers MRN: [id number] DOB:Mar 09, 1946, 77 y.o., female Today's Date: 07/27/2023   PT End of Session - 07/27/23 1345     Visit Number 7    Number of Visits 10    Date for PT Re-Evaluation 08/19/23    PT Start Time 1330    PT Stop Time 1415    PT Time Calculation (min) 45 min    Activity Tolerance Patient tolerated treatment well;No increased pain    Behavior During Therapy WFL for tasks assessed/performed             Past Medical History:  Diagnosis Date   Achilles tendinitis    Allergic rhinitis    Arthritis    Cystocele, unspecified (CODE)    Diverticulosis    Dyspareunia, female    Elevated cholesterol    GERD (gastroesophageal reflux disease)    History of heavy periods    HLD (hyperlipidemia)    Impaired glucose tolerance    Melanoma (HCC)    on her back   Melanoma in situ of back (HCC)    Osteopenia    Painful menstrual periods    Rectocele    Urticaria    Uterine prolapse    Vitamin D deficiency    Past Surgical History:  Procedure Laterality Date   APPENDECTOMY     BREAST BIOPSY Left 1991   neg   BREAST EXCISIONAL BIOPSY Left 1991   broken nose     COLONOSCOPY     COLONOSCOPY N/A 12/30/2020   Procedure: COLONOSCOPY;  Surgeon: Regis Bill, MD;  Location: [facility] END[facility]COPY;  Service: Endoscopy;  Laterality: N/A;  Patient says she needs Scolpamine patch after anesthesia for nausea.   SEPTOPLASTY     SKIN CANCER EXCISION     Patient Active Problem List   Diagnosis Date Noted   History of melanoma excision 07/21/2016   Melanosis of vulva 07/21/2016   Rectocele 07/21/2016   Midline cystocele 07/21/2016   Left knee pain 12/23/2015   History of motion sickness 09/20/2014   Allergic rhinitis 09/13/2014   Gastroesophageal reflux 09/13/2014   Hyperlipidemia, unspecified 09/13/2014   Impaired glucose tolerance 09/13/2014   Vaginal atrophy 09/13/2014   Vitamin D deficiency  09/13/2014   History of adenomatous polyp of colon 08/14/2014    PCP: Nemiah Commander  REFERRING PROVIDER: Dalbert Garnet   REFERRING DIAG: Uterine prolapse without mention of vaginal wall prolapse  Rationale for Evaluation and Treatment Rehabilitation  THERAPY DIAG:  Chronic pain of both knees  Sacrococcygeal disorders, not elsewhere classified  Other abnormalities of gait and mobility  Other low back pain  ONSET DATE:   SUBJECTIVE:        SUBJECTIVE STATEMENT TODAY:  Pt notices she is peeing better, and notices it coming out core easier, and feels she is emptied more completely.  B knee pain improved by 80% , LBP has resolved and she did have a pain injection.   SUBJECTIVE STATEMENT on EVAL 06/10/23 :  1) difficulty emptying bladder / leakage :  Pt leans forward to pees again . This started 4-5 years ago. Pt has been wearing a pessary for 5 years which helped with emptying her bladder.  Currently pt gets her pessary checked every 6 months with Dr. Dalbert Garnet. Denied straining with BMs. Pt has complete movements.  Leakage is occurring all the time. Pt changes Poise pads once a day.    2) CLBP :  at worst 8-10/10 occurs with strenuous  activities, gardening, when getting out of bed, Pt has a broken tailbone 20+ years ago.   Radiating pain on L hip to lateral L knee  Occurs with standing, walking > 15 min. It does not happen with gardening/  swimming.      3) R groin pain started 2 months ago:  started as a pinching at 9/10  that comes randomly when walking, standing, stepping   and it goes away with sitting and eases 3-5 min. Does not radiate.    4) B knee pain,: L knee pain is worst than R  level 8/10 . OA . Completed PT for L patella that was out of alignment. It started hurting again 2 years ago. R knee started to hurt. Pt was told collagen is lacking  . Completed PT for R knee as well. PT gave up on her and was recommended on knee replacement. Pt saw a second / third opinion which advised  her to TKA is not needed at this time.     PERTINENT HISTORY:  OA on B knees , no prolapse surgeries,  appendectomy, 3 vaginal deliveries,  Hx of fall and fell on her nose 20 years ago and also had a fall on her tailbone before then   Fitness:  pool exercise in her personal pool ( running, riding bike, jumping jacks, PT HEP for her back   PAIN:  Are you having pain? Yes: see above  PRECAUTIONS: None  WEIGHT BEARING RESTRICTIONS: No  FALLS:  Has patient fallen in last 6 months? No   LIVING ENVIRONMENT: Lives with: lives with their spouse Lives in: house  Stairs: stoop STE , one story home   OCCUPATION: retired from Administrator, sports,  Current hobbies: gardening, travel ,   PLOF: Independent  PATIENT GOALS:  Not pee in her pants, not go to the bathroom so often    OBJECTIVE:    Pelvic Floor Special Questions - 07/27/23 1417     External Perineal Exam through clothing: tightness of R anterior pelvic floor              [facility] Adult PT Treatment/Exercise - 07/27/23 1359       Therapeutic Activites    Other Therapeutic Activities reviewed last week's HEP , explained future sessions on extenral and internal pelvic floor assessment and what it entails and corrected her confusion on reps and techniique      Neuro Re-ed    Neuro Re-ed Details  cued for new SIJ and pelvic floor stretches      Manual Therapy   Manual therapy comments STM/MWM at R pelvic floor mm externally through clothes               HOME EXERCISE PROGRAM: See pt instruction section    ASSESSMENT:  CLINICAL IMPRESSION:               External assessment of pelvic floor showed R tightness which is likely related to R shorter leg which is being addressed with shoe lift.               Pt required excessive cues for pelvic tilt to optimize pelvic floor lengthening. Pt continues to report gradually improved ability to completely empty but will still need more pelvic floor manual Tx  to optimize lengthening and decrease tensions.   B knee pain is 80% improved with shoe lift and HEP.   Plan to assess her self-selected exercises she does in the pool to ensure they  are not contributing to radiating LBP. Plan to review body mechanics for gardening to address LBP.    Pt benefits from skilled PT.    OBJECTIVE IMPAIRMENTS decreased activity tolerance, decreased coordination, decreased endurance, decreased mobility, difficulty walking, decreased ROM, decreased strength, decreased safety awareness, hypomobility, increased muscle spasms, impaired flexibility, improper body mechanics, postural dysfunction, and pain. scar restrictions   ACTIVITY LIMITATIONS  self-care,  sleep, home chores, work tasks    PARTICIPATION LIMITATIONS:  community, gym activities    PERSONAL FACTORS        are also affecting patient's functional outcome.    REHAB POTENTIAL: Good   CLINICAL DECISION MAKING: Evolving/moderate complexity   EVALUATION COMPLEXITY: Moderate    PATIENT EDUCATION:    Education details: Showed pt anatomy images. Explained muscles attachments/ connection, physiology of deep core system/ spinal- thoracic-pelvis-lower kinetic chain as they relate to pt's presentation, Sx, and past Hx. Explained what and how these areas of deficits need to be restored to balance and function    See Therapeutic activity / neuromuscular re-education section  Answered pt's questions.   Person educated: Patient Education method: Explanation, Demonstration, Tactile cues, Verbal cues, and Handouts Education comprehension: verbalized understanding, returned demonstration, verbal cues required, tactile cues required, and needs further education     PLAN: PT FREQUENCY: 1x/week   PT DURATION: 10 weeks   PLANNED INTERVENTIONS: Therapeutic exercises, Therapeutic activity, Neuromuscular re-education, Balance training, Gait training, Patient/Family education, Self Care, Joint mobilization, Spinal  mobilization, Moist heat, Taping, and Manual therapy, dry needling.   PLAN FOR NEXT SESSION: See clinical impression for plan     GOALS: Goals reviewed with patient? Yes  SHORT TERM GOALS: Target date: 07/08/2023    Pt will demo IND with HEP                    Baseline: Not IND            Goal status: INITIAL   LONG TERM GOALS: Target date: 08/19/2023    1.Pt will demo proper deep core coordination without chest breathing and optimal excursion of diaphragm/pelvic floor in order to promote spinal stability and pelvic floor function  Baseline: dyscoordination Goal status: INITIAL  2.  Pt will demo > 5 pt change on FOTO  to improve QOL and function  PFDI Urinary baseline - 17 Lower score = better function  Urinary Problem baseline-  56 Higher score = better function  Prolapse baseline - 8 Lower score = better function  Lumber baseline  -52  Higher score = better function   Goal status: INITIAL  3.  Pt will demo proper body mechanics in against gravity tasks and ADLs  work tasks, fitness  to minimize straining pelvic floor / back    Baseline: not IND, improper form that places strain on pelvic floor  Goal status: MET     4. Pt will demo increased gait speed > 1.3 m/s with reciprocal gait pattern, longer stride length  in order to ambulate safely in community and return to fitness routine  Baseline:   Goal status: INITIAL    5. Pt reports decreased urge incontinence to <1 a month  in order improve hygiene and continence  Baseline:  urge incontinence 2 x month  Goal status: Partially Met   ( 07/15/23: no continence for two weeks)    6. Pt will no longer lean forward for emptying bladder and demo proper coordination of pelvic floor for lengthening and contraction with deep core coordination  Baseline:  Pt leans forward to pee and complete emptying  Goal status:   7.  Pt will report cook and stand in line for > 30 min without radiating pain down the L knee when  walking  Baseline:  Radiating pain on L hip to lateral L knee  Occurs with  Ongoing      Mariane Masters, PT 07/27/2023, 1:46 PM

## 2023-07-28 ENCOUNTER — Encounter: Payer: Self-pay | Admitting: Physical Therapy

## 2023-08-10 ENCOUNTER — Ambulatory Visit: Payer: Medicare Other | Attending: Obstetrics and Gynecology | Admitting: Physical Therapy

## 2023-08-10 DIAGNOSIS — M533 Sacrococcygeal disorders, not elsewhere classified: Secondary | ICD-10-CM | POA: Diagnosis present

## 2023-08-10 DIAGNOSIS — R2689 Other abnormalities of gait and mobility: Secondary | ICD-10-CM | POA: Diagnosis present

## 2023-08-10 DIAGNOSIS — M25562 Pain in left knee: Secondary | ICD-10-CM | POA: Diagnosis present

## 2023-08-10 DIAGNOSIS — G8929 Other chronic pain: Secondary | ICD-10-CM | POA: Diagnosis present

## 2023-08-10 DIAGNOSIS — M5459 Other low back pain: Secondary | ICD-10-CM | POA: Diagnosis present

## 2023-08-10 DIAGNOSIS — M25561 Pain in right knee: Secondary | ICD-10-CM | POA: Insufficient documentation

## 2023-08-10 NOTE — Therapy (Signed)
OUTPATIENT PHYSICAL THERAPY TREATMENT    Patient Name: Tracy Rodgers MRN: [id number] DOB:15-May-1946, 77 y.o., female Today's Date: 08/10/2023   PT End of Session - 08/10/23 1423     Visit Number 8    Number of Visits 10    Date for PT Re-Evaluation 08/19/23    PT Start Time 1419    PT Stop Time 1500    PT Time Calculation (min) 41 min    Activity Tolerance Patient tolerated treatment well;No increased pain    Behavior During Therapy WFL for tasks assessed/performed             Past Medical History:  Diagnosis Date   Achilles tendinitis    Allergic rhinitis    Arthritis    Cystocele, unspecified (CODE)    Diverticulosis    Dyspareunia, female    Elevated cholesterol    GERD (gastroesophageal reflux disease)    History of heavy periods    HLD (hyperlipidemia)    Impaired glucose tolerance    Melanoma (HCC)    on her back   Melanoma in situ of back (HCC)    Osteopenia    Painful menstrual periods    Rectocele    Urticaria    Uterine prolapse    Vitamin D deficiency    Past Surgical History:  Procedure Laterality Date   APPENDECTOMY     BREAST BIOPSY Left 1991   neg   BREAST EXCISIONAL BIOPSY Left 1991   broken nose     COLONOSCOPY     COLONOSCOPY N/A 12/30/2020   Procedure: COLONOSCOPY;  Surgeon: Regis Bill, MD;  Location: [facility];  Service: Endoscopy;  Laterality: N/A;  Patient says she needs Scolpamine patch after anesthesia for nausea.   SEPTOPLASTY     SKIN CANCER EXCISION     Patient Active Problem List   Diagnosis Date Noted   History of melanoma excision 07/21/2016   Melanosis of vulva 07/21/2016   Rectocele 07/21/2016   Midline cystocele 07/21/2016   Left knee pain 12/23/2015   History of motion sickness 09/20/2014   Allergic rhinitis 09/13/2014   Gastroesophageal reflux 09/13/2014   Hyperlipidemia, unspecified 09/13/2014   Impaired glucose tolerance 09/13/2014   Vaginal atrophy 09/13/2014   Vitamin D deficiency  09/13/2014   History of adenomatous polyp of colon 08/14/2014    PCP: Nemiah Commander  REFERRING PROVIDER: Renita Papa DIAG: Uterine prolapse without mention of vaginal wall prolapse  Rationale for Evaluation and Treatment Rehabilitation  THERAPY DIAG:  Chronic pain of both knees  Sacrococcygeal disorders, not elsewhere classified  Other abnormalities of gait and mobility  Other low back pain  ONSET DATE:   SUBJECTIVE:        SUBJECTIVE STATEMENT TODAY:  Pt has not been able to perform the winging exercise due to shoulder bursitis this week.   Pt still has leakage before making it to the bathroom . Pt wears a pessary which is due for pessary check every 6 months. Pt has been wearing  a pessary for the 10 years.   SUBJECTIVE STATEMENT on EVAL 06/10/23 :  1) difficulty emptying bladder / leakage :  Pt leans forward to pees again . This started 4-5 years ago. Pt has been wearing a pessary for 5 years which helped with emptying her bladder.  Currently pt gets her pessary checked every 6 months with Dr. Dalbert Garnet. Denied straining with BMs. Pt has complete movements.  Leakage is occurring all the time. Pt changes Poise pads once  a day.    2) CLBP :  at worst 8-10/10 occurs with strenuous activities, gardening, when getting out of bed, Pt has a broken tailbone 20+ years ago.   Radiating pain on L hip to lateral L knee  Occurs with standing, walking > 15 min. It does not happen with gardening/  swimming.      3) R groin pain started 2 months ago:  started as a pinching at 9/10  that comes randomly when walking, standing, stepping   and it goes away with sitting and eases 3-5 min. Does not radiate.    4) B knee pain,: L knee pain is worst than R  level 8/10 . OA . Completed PT for L patella that was out of alignment. It started hurting again 2 years ago. R knee started to hurt. Pt was told collagen is lacking  . Completed PT for R knee as well. PT gave up on her and was recommended  on knee replacement. Pt saw a second / third opinion which advised her to TKA is not needed at this time.     PERTINENT HISTORY:  OA on B knees , no prolapse surgeries,  appendectomy, 3 vaginal deliveries,  Hx of fall and fell on her nose 20 years ago and also had a fall on her tailbone before then   Fitness:  pool exercise in her personal pool ( running, riding bike, jumping jacks, PT HEP for her back   PAIN:  Are you having pain? Yes: see above  PRECAUTIONS: None  WEIGHT BEARING RESTRICTIONS: No  FALLS:  Has patient fallen in last 6 months? No   LIVING ENVIRONMENT: Lives with: lives with their spouse Lives in: house  Stairs: stoop STE , one story home   OCCUPATION: retired from Administrator, sports,  Current hobbies: gardening, travel ,   PLOF: Independent  PATIENT GOALS:  Not pee in her pants, not go to the bathroom so often    OBJECTIVE:     Pelvic Floor Special Questions - 08/10/23 1510     Pelvic Floor Internal Exam pt consented verbally without contraindications    Exam Type Vaginal    Palpation tenderness and tightness at 1-3 oclock , obt int L, dyscoordination of pelvic floor, minimal movement of pelvic floor due to overuse of ab and dyscoordination of deep core system    Strength weak squeeze, no lift             [facility] Adult PT Treatment/Exercise - 08/10/23 1616       Therapeutic Activites    Other Therapeutic Activities explained pelvic floor mobility to help with her Sx and how to correct dyscoordination of deep core and minimize overuse of ab mm      Neuro Re-ed    Neuro Re-ed Details  excessive cues for less ab overuse with deep core HEP to optimize lenghtening of pelvic floor      Manual Therapy   Internal Pelvic Floor STM/MWM at L pelvic floor notied assessment to optimize lenghtening and mobility               HOME EXERCISE PROGRAM: See pt instruction section    ASSESSMENT:  CLINICAL IMPRESSION:               lnternal  assessment of pelvic floor showed L tightness and dyscoordination of pelvic floor, minimal movement of pelvic floor due to overuse of ab and dyscoordination of deep core system.  Pt required excessive cues for correcting overuse of ab and to optimize pelvic floor lengthening. Pt continues to report gradually improved ability to completely empty but will still need more pelvic floor manual Tx to optimize lengthening and decrease tensions.    Plan to assess her self-selected exercises she does in the pool to ensure they are not contributing to radiating LBP. Plan to review body mechanics for gardening to address LBP.    Pt benefits from skilled PT.    OBJECTIVE IMPAIRMENTS decreased activity tolerance, decreased coordination, decreased endurance, decreased mobility, difficulty walking, decreased ROM, decreased strength, decreased safety awareness, hypomobility, increased muscle spasms, impaired flexibility, improper body mechanics, postural dysfunction, and pain. scar restrictions   ACTIVITY LIMITATIONS  self-care,  sleep, home chores, work tasks    PARTICIPATION LIMITATIONS:  community, gym activities    PERSONAL FACTORS        are also affecting patient's functional outcome.    REHAB POTENTIAL: Good   CLINICAL DECISION MAKING: Evolving/moderate complexity   EVALUATION COMPLEXITY: Moderate    PATIENT EDUCATION:    Education details: Showed pt anatomy images. Explained muscles attachments/ connection, physiology of deep core system/ spinal- thoracic-pelvis-lower kinetic chain as they relate to pt's presentation, Sx, and past Hx. Explained what and how these areas of deficits need to be restored to balance and function    See Therapeutic activity / neuromuscular re-education section  Answered pt's questions.   Person educated: Patient Education method: Explanation, Demonstration, Tactile cues, Verbal cues, and Handouts Education comprehension: verbalized understanding,  returned demonstration, verbal cues required, tactile cues required, and needs further education     PLAN: PT FREQUENCY: 1x/week   PT DURATION: 10 weeks   PLANNED INTERVENTIONS: Therapeutic exercises, Therapeutic activity, Neuromuscular re-education, Balance training, Gait training, Patient/Family education, Self Care, Joint mobilization, Spinal mobilization, Moist heat, Taping, and Manual therapy, dry needling.   PLAN FOR NEXT SESSION: See clinical impression for plan     GOALS: Goals reviewed with patient? Yes  SHORT TERM GOALS: Target date: 07/08/2023    Pt will demo IND with HEP                    Baseline: Not IND            Goal status: INITIAL   LONG TERM GOALS: Target date: 08/19/2023    1.Pt will demo proper deep core coordination without chest breathing and optimal excursion of diaphragm/pelvic floor in order to promote spinal stability and pelvic floor function  Baseline: dyscoordination Goal status: INITIAL  2.  Pt will demo > 5 pt change on FOTO  to improve QOL and function  PFDI Urinary baseline - 17 Lower score = better function  Urinary Problem baseline-  56 Higher score = better function  Prolapse baseline - 8 Lower score = better function  Lumber baseline  -52  Higher score = better function   Goal status: INITIAL  3.  Pt will demo proper body mechanics in against gravity tasks and ADLs  work tasks, fitness  to minimize straining pelvic floor / back    Baseline: not IND, improper form that places strain on pelvic floor  Goal status: MET     4. Pt will demo increased gait speed > 1.3 m/s with reciprocal gait pattern, longer stride length  in order to ambulate safely in community and return to fitness routine  Baseline:   Goal status: INITIAL    5. Pt reports decreased urge incontinence to <1 a month  in order improve hygiene and continence  Baseline:  urge incontinence 2 x month  Goal status: Partially Met   ( 07/15/23: no continence  for two weeks)    6. Pt will no longer lean forward for emptying bladder and demo proper coordination of pelvic floor for lengthening and contraction with deep core coordination   Baseline:  Pt leans forward to pee and complete emptying  Goal status:   7.  Pt will report cook and stand in line for > 30 min without radiating pain down the L knee when walking  Baseline:  Radiating pain on L hip to lateral L knee  Occurs with  Ongoing      Mariane Masters, PT 08/10/2023, 2:23 PM

## 2023-08-20 ENCOUNTER — Ambulatory Visit: Payer: Medicare Other | Admitting: Physical Therapy

## 2023-08-20 DIAGNOSIS — M25561 Pain in right knee: Secondary | ICD-10-CM | POA: Diagnosis not present

## 2023-08-20 DIAGNOSIS — M533 Sacrococcygeal disorders, not elsewhere classified: Secondary | ICD-10-CM

## 2023-08-20 DIAGNOSIS — G8929 Other chronic pain: Secondary | ICD-10-CM

## 2023-08-20 DIAGNOSIS — M5459 Other low back pain: Secondary | ICD-10-CM

## 2023-08-20 DIAGNOSIS — R2689 Other abnormalities of gait and mobility: Secondary | ICD-10-CM

## 2023-08-20 NOTE — Therapy (Signed)
OUTPATIENT PHYSICAL THERAPY TREATMENT   / Recert     Patient Name: Tracy Rodgers MRN: [id number] DOB:January 21, 1946, 77 y.o., female Today's Date: 08/20/2023   PT End of Session - 08/20/23 1025     Visit Number 9    Number of Visits 19    Date for PT Re-Evaluation 10/29/23    PT Start Time 1020    PT Stop Time 1100    PT Time Calculation (min) 40 min    Activity Tolerance Patient tolerated treatment well;No increased pain    Behavior During Therapy WFL for tasks assessed/performed             Past Medical History:  Diagnosis Date   Achilles tendinitis    Allergic rhinitis    Arthritis    Cystocele, unspecified (CODE)    Diverticulosis    Dyspareunia, female    Elevated cholesterol    GERD (gastroesophageal reflux disease)    History of heavy periods    HLD (hyperlipidemia)    Impaired glucose tolerance    Melanoma (HCC)    on her back   Melanoma in situ of back (HCC)    Osteopenia    Painful menstrual periods    Rectocele    Urticaria    Uterine prolapse    Vitamin D deficiency    Past Surgical History:  Procedure Laterality Date   APPENDECTOMY     BREAST BIOPSY Left 1991   neg   BREAST EXCISIONAL BIOPSY Left 1991   broken nose     COLONOSCOPY     COLONOSCOPY N/A 12/30/2020   Procedure: COLONOSCOPY;  Surgeon: Regis Bill, MD;  Location: [facility] ENDOSCOPY;  Service: Endoscopy;  Laterality: N/A;  Patient says she needs Scolpamine patch after anesthesia for nausea.   SEPTOPLASTY     SKIN CANCER EXCISION     Patient Active Problem List   Diagnosis Date Noted   History of melanoma excision 07/21/2016   Melanosis of vulva 07/21/2016   Rectocele 07/21/2016   Midline cystocele 07/21/2016   Left knee pain 12/23/2015   History of motion sickness 09/20/2014   Allergic rhinitis 09/13/2014   Gastroesophageal reflux 09/13/2014   Hyperlipidemia, unspecified 09/13/2014   Impaired glucose tolerance 09/13/2014   Vaginal atrophy 09/13/2014   Vitamin D  deficiency 09/13/2014   History of adenomatous polyp of colon 08/14/2014    PCP: Nemiah Commander  REFERRING PROVIDER: Renita Papa DIAG: Uterine prolapse without mention of vaginal wall prolapse  Rationale for Evaluation and Treatment Rehabilitation  THERAPY DIAG:     Sacrococcygeal disorders, not elsewhere classified   Other abnormalities of gait and mobility   Other low back pain  Chronic pain of both knees    ONSET DATE:   SUBJECTIVE:        SUBJECTIVE STATEMENT TODAY:  Pt has had not LBP for a month. It comes and goes and she knows when to rest and not overdo. Pt has not had tingling in her L leg since the last session.    Pt has noticed improvement with her urinary pad is no longer smelling if urine by the end of the day Pt saw her OB/GYN who remeasured her prolapse and was told she has Stage I prolapse compared to Stage 2 which was measured 5 years ago. Pt still wears a pessary.   Pt had questions about the breathing exercises.    SUBJECTIVE STATEMENT on EVAL 06/10/23 :  1) difficulty emptying bladder / leakage :  Pt leans forward  to pees again . This started 4-5 years ago. Pt has been wearing a pessary for 5 years which helped with emptying her bladder.  Currently pt gets her pessary checked every 6 months with Dr. Dalbert Garnet. Denied straining with BMs. Pt has complete movements.  Leakage is occurring all the time. Pt changes Poise pads once a day.    2) CLBP :  at worst 8-10/10 occurs with strenuous activities, gardening, when getting out of bed, Pt has a broken tailbone 20+ years ago.   Radiating pain on L hip to lateral L knee  Occurs with standing, walking > 15 min. It does not happen with gardening/  swimming.      3) R groin pain started 2 months ago:  started as a pinching at 9/10  that comes randomly when walking, standing, stepping   and it goes away with sitting and eases 3-5 min. Does not radiate.    4) B knee pain,: L knee pain is worst than R  level  8/10 . OA . Completed PT for L patella that was out of alignment. It started hurting again 2 years ago. R knee started to hurt. Pt was told collagen is lacking  . Completed PT for R knee as well. PT gave up on her and was recommended on knee replacement. Pt saw a second / third opinion which advised her to TKA is not needed at this time.     PERTINENT HISTORY:  OA on B knees , no prolapse surgeries,  appendectomy, 3 vaginal deliveries,  Hx of fall and fell on her nose 20 years ago and also had a fall on her tailbone before then   Fitness:  pool exercise in her personal pool ( running, riding bike, jumping jacks, PT HEP for her back   PAIN:  Are you having pain? Yes: see above  PRECAUTIONS: None  WEIGHT BEARING RESTRICTIONS: No  FALLS:  Has patient fallen in last 6 months? No   LIVING ENVIRONMENT: Lives with: lives with their spouse Lives in: house  Stairs: stoop STE , one story home   OCCUPATION: retired from Administrator, sports,  Current hobbies: gardening, travel ,   PLOF: Independent  PATIENT GOALS:  Not pee in her pants, not go to the bathroom so often    OBJECTIVE:     [facility] PT Assessment - 08/20/23 1141       Observation/Other Assessments   Observations poor tehcnique with sit > supine      Coordination   Coordination and Movement Description no more ab overuse with deep core             [facility] Adult PT Treatment/Exercise - 08/20/23 1142       Therapeutic Activites    Other Therapeutic Activities explained difference with "belly breathing" and diaphragm breathing, explained importance of body mechanics to minimize prolapse ( log rolling as pt demo'd poor technique with risk for back strain sit> supiine)      Neuro Re-ed    Neuro Re-ed Details  excessive cues for log rolling, deep core level 2                HOME EXERCISE PROGRAM: See pt instruction section    ASSESSMENT:  CLINICAL IMPRESSION:   Pt met 2/7 goals and progressing  well towards remaining goals related to minimizing knee pain and urinary Sx. LBP has resolved. Leakage and L knee pain is gradually improving.   Improvements :  Pt has had not  LBP for a month. It comes and goes and she knows when to rest and not overdo. Pt has not had tingling in her L leg since the last session.    Pt has noticed improvement with her urinary pad is no longer smelling if urine by the end of the day Pt saw her OB/GYN who remeasured her prolapse and was told she has Stage I prolapse compared to Stage 2 which was measured 5 years ago. Pt still wears a pessary.  Pt demo'd levelled pelvis and spine with shoe lift in R shoe toe box and heel  Pt is still working on learning how to coordinate deep core system without straining abdominal muscles which will continue to help with prolapse and urinary Sx.                  Today, reassessed deep core training and pt demo'd no more ab / chest overuse with technique. Pt demo'd proper pelvic floor movement with deep core training. Plan to assess L knee and LKC at next session.   Plan to assess her self-selected exercises she does in the pool to ensure they are not contributing to radiating LBP. Plan to review body mechanics for gardening to address LBP.    Pt benefits from skilled PT.    OBJECTIVE IMPAIRMENTS decreased activity tolerance, decreased coordination, decreased endurance, decreased mobility, difficulty walking, decreased ROM, decreased strength, decreased safety awareness, hypomobility, increased muscle spasms, impaired flexibility, improper body mechanics, postural dysfunction, and pain. scar restrictions   ACTIVITY LIMITATIONS  self-care,  sleep, home chores, work tasks    PARTICIPATION LIMITATIONS:  community, gym activities    PERSONAL FACTORS        are also affecting patient's functional outcome.    REHAB POTENTIAL: Good   CLINICAL DECISION MAKING: Evolving/moderate complexity   EVALUATION COMPLEXITY: Moderate     PATIENT EDUCATION:    Education details: Showed pt anatomy images. Explained muscles attachments/ connection, physiology of deep core system/ spinal- thoracic-pelvis-lower kinetic chain as they relate to pt's presentation, Sx, and past Hx. Explained what and how these areas of deficits need to be restored to balance and function    See Therapeutic activity / neuromuscular re-education section  Answered pt's questions.   Person educated: Patient Education method: Explanation, Demonstration, Tactile cues, Verbal cues, and Handouts Education comprehension: verbalized understanding, returned demonstration, verbal cues required, tactile cues required, and needs further education     PLAN: PT FREQUENCY: 1x/week   PT DURATION: 10 weeks   PLANNED INTERVENTIONS: Therapeutic exercises, Therapeutic activity, Neuromuscular re-education, Balance training, Gait training, Patient/Family education, Self Care, Joint mobilization, Spinal mobilization, Moist heat, Taping, and Manual therapy, dry needling.   PLAN FOR NEXT SESSION: See clinical impression for plan     GOALS: Goals reviewed with patient? Yes  SHORT TERM GOALS: Target date: 07/08/2023    Pt will demo IND with HEP                    Baseline: Not IND            Goal status: MET    LONG TERM GOALS: Target date: 10/29/2023      1.Pt will demo proper deep core coordination without chest breathing and optimal excursion of diaphragm/pelvic floor in order to promote spinal stability and pelvic floor function  Baseline: dyscoordination Goal status: MET   2.  Pt will demo > 5 pt change on FOTO  to improve QOL and function  PFDI Urinary  baseline - 17 Lower score = better function  Urinary Problem baseline-  56 Higher score = better function  Prolapse baseline - 8 Lower score = better function  Lumber baseline  -52  Higher score = better function   Goal status:onging   3.  Pt will demo proper body mechanics in against  gravity tasks and ADLs  work tasks, fitness  to minimize straining pelvic floor / back    Baseline: not IND, improper form that places strain on pelvic floor  Goal status: MET     4. Pt will demo increased gait speed > 1.3 m/s with reciprocal gait pattern, longer stride length  in order to ambulate safely in community and return to fitness routine  Baseline:  Goal status: ongoing     5. Pt reports decreased urge incontinence to <1 a month  in order improve hygiene and continence  Baseline:  urge incontinence 2 x month  Goal status: Partially Met   ( 07/15/23: no continence for two weeks)    6. Pt will no longer lean forward for emptying bladder and demo proper coordination of pelvic floor for lengthening and contraction with deep core coordination   Baseline:  Pt leans forward to pee and complete emptying  Goal status:  Ongoing     7.  Pt will report cook and stand in line for > 30 min without radiating pain down the L knee when walking Baseline:  Radiating pain on L hip to lateral L knee  Occurs with   Goal status:  Ongoing     Mariane Masters, PT 08/20/2023, 10:26 AM

## 2023-08-25 ENCOUNTER — Ambulatory Visit: Payer: Medicare Other | Admitting: Physical Therapy

## 2023-08-25 DIAGNOSIS — M5459 Other low back pain: Secondary | ICD-10-CM

## 2023-08-25 DIAGNOSIS — M533 Sacrococcygeal disorders, not elsewhere classified: Secondary | ICD-10-CM

## 2023-08-25 DIAGNOSIS — M25561 Pain in right knee: Secondary | ICD-10-CM | POA: Diagnosis not present

## 2023-08-25 DIAGNOSIS — G8929 Other chronic pain: Secondary | ICD-10-CM

## 2023-08-25 DIAGNOSIS — R2689 Other abnormalities of gait and mobility: Secondary | ICD-10-CM

## 2023-08-25 NOTE — Therapy (Signed)
OUTPATIENT PHYSICAL THERAPY TREATMENT   / Progress Note from EVAL 06/10/23 to 08/25/23     Patient Name: Tracy Rodgers MRN: [id number] DOB:1946-03-14, 77 y.o., female Today's Date: 08/25/2023   PT End of Session - 08/25/23 1035     Visit Number 10    Number of Visits 19    Date for PT Re-Evaluation 10/29/23    PT Start Time 1022    PT Stop Time 1100    PT Time Calculation (min) 38 min    Activity Tolerance Patient tolerated treatment well;No increased pain    Behavior During Therapy WFL for tasks assessed/performed             Past Medical History:  Diagnosis Date   Achilles tendinitis    Allergic rhinitis    Arthritis    Cystocele, unspecified (CODE)    Diverticulosis    Dyspareunia, female    Elevated cholesterol    GERD (gastroesophageal reflux disease)    History of heavy periods    HLD (hyperlipidemia)    Impaired glucose tolerance    Melanoma (HCC)    on her back   Melanoma in situ of back (HCC)    Osteopenia    Painful menstrual periods    Rectocele    Urticaria    Uterine prolapse    Vitamin D deficiency    Past Surgical History:  Procedure Laterality Date   APPENDECTOMY     BREAST BIOPSY Left 1991   neg   BREAST EXCISIONAL BIOPSY Left 1991   broken nose     COLONOSCOPY     COLONOSCOPY N/A 12/30/2020   Procedure: COLONOSCOPY;  Surgeon: Regis Bill, MD;  Location: [facility] ENDOSCOPY;  Service: Endoscopy;  Laterality: N/A;  Patient says she needs Scolpamine patch after anesthesia for nausea.   SEPTOPLASTY     SKIN CANCER EXCISION     Patient Active Problem List   Diagnosis Date Noted   History of melanoma excision 07/21/2016   Melanosis of vulva 07/21/2016   Rectocele 07/21/2016   Midline cystocele 07/21/2016   Left knee pain 12/23/2015   History of motion sickness 09/20/2014   Allergic rhinitis 09/13/2014   Gastroesophageal reflux 09/13/2014   Hyperlipidemia, unspecified 09/13/2014   Impaired glucose tolerance 09/13/2014   Vaginal  atrophy 09/13/2014   Vitamin D deficiency 09/13/2014   History of adenomatous polyp of colon 08/14/2014    PCP: Nemiah Commander  REFERRING PROVIDER: Renita Papa DIAG: Uterine prolapse without mention of vaginal wall prolapse  Rationale for Evaluation and Treatment Rehabilitation  THERAPY DIAG:     Sacrococcygeal disorders, not elsewhere classified   Other abnormalities of gait and mobility   Other low back pain  Chronic pain of both knees    ONSET DATE:   SUBJECTIVE:        SUBJECTIVE STATEMENT TODAY:  Pt has noticed one episode of sciatic pain last week after shopping. Pt has been practicing deep core level 1-2   SUBJECTIVE STATEMENT on EVAL 06/10/23 :  1) difficulty emptying bladder / leakage :  Pt leans forward to pees again . This started 4-5 years ago. Pt has been wearing a pessary for 5 years which helped with emptying her bladder.  Currently pt gets her pessary checked every 6 months with Dr. Dalbert Garnet. Denied straining with BMs. Pt has complete movements.  Leakage is occurring all the time. Pt changes Poise pads once a day.    2) CLBP :  at worst 8-10/10 occurs with strenuous  activities, gardening, when getting out of bed, Pt has a broken tailbone 20+ years ago.   Radiating pain on L hip to lateral L knee  Occurs with standing, walking > 15 min. It does not happen with gardening/  swimming.      3) R groin pain started 2 months ago:  started as a pinching at 9/10  that comes randomly when walking, standing, stepping   and it goes away with sitting and eases 3-5 min. Does not radiate.    4) B knee pain,: L knee pain is worst than R  level 8/10 . OA . Completed PT for L patella that was out of alignment. It started hurting again 2 years ago. R knee started to hurt. Pt was told collagen is lacking  . Completed PT for R knee as well. PT gave up on her and was recommended on knee replacement. Pt saw a second / third opinion which advised her to TKA is not needed at this  time.     PERTINENT HISTORY:  OA on B knees , no prolapse surgeries,  appendectomy, 3 vaginal deliveries,  Hx of fall and fell on her nose 20 years ago and also had a fall on her tailbone before then   Fitness:  pool exercise in her personal pool ( running, riding bike, jumping jacks, PT HEP for her back   PAIN:  Are you having pain? Yes: see above  PRECAUTIONS: None  WEIGHT BEARING RESTRICTIONS: No  FALLS:  Has patient fallen in last 6 months? No   LIVING ENVIRONMENT: Lives with: lives with their spouse Lives in: house  Stairs: stoop STE , one story home   OCCUPATION: retired from Administrator, sports,  Current hobbies: gardening, travel ,   PLOF: Independent  PATIENT GOALS:  Not pee in her pants, not go to the bathroom so often    OBJECTIVE:      [facility] PT Assessment - 08/25/23 1334       Observation/Other Assessments   Observations no more ab overuse, minor chest breathing which required cues in deep core level 1-2 reassessment             [facility] Adult PT Treatment/Exercise - 08/25/23 0001       Therapeutic Activites    Other Therapeutic Activities reassessed goals      Neuro Re-ed    Neuro Re-ed Details  cued for deep core level 1-2               HOME EXERCISE PROGRAM: See pt instruction section    ASSESSMENT:  CLINICAL IMPRESSION:   Pt met 2/7 goals and progressing well towards remaining goals related to minimizing knee pain and urinary Sx. LBP has resolved. Leakage and L knee pain is gradually improving.   Improvements :  Pt has had not LBP for a month. It comes and goes and she knows when to rest and not overdo. Pt has not had tingling in her L leg since the last session.    Pt has noticed improvement with her urinary pad is no longer smelling if urine by the end of the day Pt saw her OB/GYN who remeasured her prolapse and was told she has Stage I prolapse compared to Stage 2 which was measured 5 years ago. Pt still wears a  pessary.  Pt demo'd levelled pelvis and spine with shoe lift in R shoe toe box and heel  Pt is still working on learning how to coordinate deep core  system without straining abdominal muscles which will continue to help with prolapse and urinary Sx.                  Today, reassessed deep core training and pt demo'd no more ab / chest overuse with technique. Pt demo'd proper pelvic floor movement with deep core training. Provided more cues for proper technique.              Plan to assess L knee and LKC at next session.   Plan to assess her self-selected exercises she does in the pool to ensure they are not contributing to radiating LBP. Plan to review body mechanics for gardening to address LBP.    Pt benefits from skilled PT.    OBJECTIVE IMPAIRMENTS decreased activity tolerance, decreased coordination, decreased endurance, decreased mobility, difficulty walking, decreased ROM, decreased strength, decreased safety awareness, hypomobility, increased muscle spasms, impaired flexibility, improper body mechanics, postural dysfunction, and pain. scar restrictions   ACTIVITY LIMITATIONS  self-care,  sleep, home chores, work tasks    PARTICIPATION LIMITATIONS:  community, gym activities    PERSONAL FACTORS        are also affecting patient's functional outcome.    REHAB POTENTIAL: Good   CLINICAL DECISION MAKING: Evolving/moderate complexity   EVALUATION COMPLEXITY: Moderate    PATIENT EDUCATION:    Education details: Showed pt anatomy images. Explained muscles attachments/ connection, physiology of deep core system/ spinal- thoracic-pelvis-lower kinetic chain as they relate to pt's presentation, Sx, and past Hx. Explained what and how these areas of deficits need to be restored to balance and function    See Therapeutic activity / neuromuscular re-education section  Answered pt's questions.   Person educated: Patient Education method: Explanation, Demonstration, Tactile cues,  Verbal cues, and Handouts Education comprehension: verbalized understanding, returned demonstration, verbal cues required, tactile cues required, and needs further education     PLAN: PT FREQUENCY: 1x/week   PT DURATION: 10 weeks   PLANNED INTERVENTIONS: Therapeutic exercises, Therapeutic activity, Neuromuscular re-education, Balance training, Gait training, Patient/Family education, Self Care, Joint mobilization, Spinal mobilization, Moist heat, Taping, and Manual therapy, dry needling.   PLAN FOR NEXT SESSION: See clinical impression for plan     GOALS: Goals reviewed with patient? Yes  SHORT TERM GOALS: Target date: 07/08/2023    Pt will demo IND with HEP                    Baseline: Not IND            Goal status: MET    LONG TERM GOALS: Target date: 10/29/2023      1.Pt will demo proper deep core coordination without chest breathing and optimal excursion of diaphragm/pelvic floor in order to promote spinal stability and pelvic floor function  Baseline: dyscoordination Goal status: MET   2.  Pt will demo > 5 pt change on FOTO  to improve QOL and function  PFDI Urinary baseline - 17  -> 13  Lower score = better function  MET   Urinary Problem baseline-  56  -> 56 Higher score = better function ONGOING   Prolapse baseline - 8   --> 13  Lower score = better function   ONGOING   Lumber baseline  -52   --> 61 Higher score = better function  MET    Goal status  3.  Pt will demo proper body mechanics in against gravity tasks and ADLs  work tasks, fitness  to minimize straining  pelvic floor / back    Baseline: not IND, improper form that places strain on pelvic floor  Goal status: MET     4. Pt will demo increased gait speed > 1.3 m/s with reciprocal gait pattern, longer stride length  in order to ambulate safely in community and return to fitness routine  Baseline:  Goal status: ongoing     5. Pt reports decreased urge incontinence to <1 a month  in order  improve hygiene and continence  Baseline:  urge incontinence 2 x month  Goal status: Partially Met   ( 07/15/23: no continence for two weeks)    6. Pt will no longer lean forward for emptying bladder and demo proper coordination of pelvic floor for lengthening and contraction with deep core coordination   Baseline:  Pt leans forward to pee and complete emptying  Goal status:  Ongoing     7.  Pt will report cook and stand in line for > 30 min without radiating pain down the L knee when walking Baseline:  Radiating pain on L hip to lateral L knee  Occurs with   Goal status:  Ongoing     Mariane Masters, PT 08/25/2023, 10:36 AM

## 2023-08-26 ENCOUNTER — Encounter: Payer: Medicare Other | Admitting: Physical Therapy

## 2023-08-27 ENCOUNTER — Encounter: Payer: Medicare Other | Admitting: Physical Therapy

## 2023-09-07 ENCOUNTER — Ambulatory Visit: Payer: Medicare Other | Attending: Obstetrics and Gynecology | Admitting: Physical Therapy

## 2023-09-07 DIAGNOSIS — R2689 Other abnormalities of gait and mobility: Secondary | ICD-10-CM | POA: Diagnosis present

## 2023-09-07 DIAGNOSIS — M25561 Pain in right knee: Secondary | ICD-10-CM | POA: Insufficient documentation

## 2023-09-07 DIAGNOSIS — G8929 Other chronic pain: Secondary | ICD-10-CM | POA: Diagnosis present

## 2023-09-07 DIAGNOSIS — M25562 Pain in left knee: Secondary | ICD-10-CM | POA: Insufficient documentation

## 2023-09-07 DIAGNOSIS — M533 Sacrococcygeal disorders, not elsewhere classified: Secondary | ICD-10-CM | POA: Insufficient documentation

## 2023-09-07 DIAGNOSIS — M5459 Other low back pain: Secondary | ICD-10-CM | POA: Insufficient documentation

## 2023-09-07 NOTE — Therapy (Signed)
OUTPATIENT PHYSICAL THERAPY TREATMENT      Patient Name: Tracy Rodgers MRN: [id number] DOB:April 07, 1946, 77 y.o., female Today's Date: 09/07/2023   PT End of Session - 09/07/23 0948     Visit Number 11    Number of Visits 19    Date for PT Re-Evaluation 10/29/23    PT Start Time 0940    PT Stop Time 1018    PT Time Calculation (min) 38 min    Activity Tolerance Patient tolerated treatment well;No increased pain    Behavior During Therapy WFL for tasks assessed/performed             Past Medical History:  Diagnosis Date   Achilles tendinitis    Allergic rhinitis    Arthritis    Cystocele, unspecified (CODE)    Diverticulosis    Dyspareunia, female    Elevated cholesterol    GERD (gastroesophageal reflux disease)    History of heavy periods    HLD (hyperlipidemia)    Impaired glucose tolerance    Melanoma (HCC)    on her back   Melanoma in situ of back (HCC)    Osteopenia    Painful menstrual periods    Rectocele    Urticaria    Uterine prolapse    Vitamin D deficiency    Past Surgical History:  Procedure Laterality Date   APPENDECTOMY     BREAST BIOPSY Left 1991   neg   BREAST EXCISIONAL BIOPSY Left 1991   broken nose     COLONOSCOPY     COLONOSCOPY N/A 12/30/2020   Procedure: COLONOSCOPY;  Surgeon: Regis Bill, MD;  Location: [facility] ENDOSCOPY;  Service: Endoscopy;  Laterality: N/A;  Patient says she needs Scolpamine patch after anesthesia for nausea.   SEPTOPLASTY     SKIN CANCER EXCISION     Patient Active Problem List   Diagnosis Date Noted   History of melanoma excision 07/21/2016   Melanosis of vulva 07/21/2016   Rectocele 07/21/2016   Midline cystocele 07/21/2016   Left knee pain 12/23/2015   History of motion sickness 09/20/2014   Allergic rhinitis 09/13/2014   Gastroesophageal reflux 09/13/2014   Hyperlipidemia, unspecified 09/13/2014   Impaired glucose tolerance 09/13/2014   Vaginal atrophy 09/13/2014   Vitamin D deficiency  09/13/2014   History of adenomatous polyp of colon 08/14/2014    PCP: Nemiah Commander  REFERRING PROVIDER: Dalbert Garnet   REFERRING DIAG: Uterine prolapse without mention of vaginal wall prolapse  Rationale for Evaluation and Treatment Rehabilitation  THERAPY DIAG:     Sacrococcygeal disorders, not elsewhere classified   Other abnormalities of gait and mobility   Other low back pain  Chronic pain of both knees    ONSET DATE:   SUBJECTIVE:        SUBJECTIVE STATEMENT TODAY:  Pt has noticed increased frequent trips to urinate and had increased leakage during the past weeks while she was hosting many people at her house and she noticed these urinary episodes occurred when she was stressed  Pt noticed she takes her time when urinating, does puzzles and not feel rushed, she no longer has to lean forward to start peeing  SUBJECTIVE STATEMENT on EVAL 06/10/23 :  1) difficulty emptying bladder / leakage :  Pt leans forward to pees again . This started 4-5 years ago. Pt has been wearing a pessary for 5 years which helped with emptying her bladder.  Currently pt gets her pessary checked every 6 months with Dr. Dalbert Garnet. Denied straining with BMs.  Pt has complete movements.  Leakage is occurring all the time. Pt changes Poise pads once a day.    2) CLBP :  at worst 8-10/10 occurs with strenuous activities, gardening, when getting out of bed, Pt has a broken tailbone 20+ years ago.   Radiating pain on L hip to lateral L knee  Occurs with standing, walking > 15 min. It does not happen with gardening/  swimming.      3) R groin pain started 2 months ago:  started as a pinching at 9/10  that comes randomly when walking, standing, stepping   and it goes away with sitting and eases 3-5 min. Does not radiate.    4) B knee pain,: L knee pain is worst than R  level 8/10 . OA . Completed PT for L patella that was out of alignment. It started hurting again 2 years ago. R knee started to hurt. Pt was told  collagen is lacking  . Completed PT for R knee as well. PT gave up on her and was recommended on knee replacement. Pt saw a second / third opinion which advised her to TKA is not needed at this time.     PERTINENT HISTORY:  OA on B knees , no prolapse surgeries,  appendectomy, 3 vaginal deliveries,  Hx of fall and fell on her nose 20 years ago and also had a fall on her tailbone before then   Fitness:  pool exercise in her personal pool ( running, riding bike, jumping jacks, PT HEP for her back   PAIN:  Are you having pain? Yes: see above  PRECAUTIONS: None  WEIGHT BEARING RESTRICTIONS: No  FALLS:  Has patient fallen in last 6 months? No   LIVING ENVIRONMENT: Lives with: lives with their spouse Lives in: house  Stairs: stoop STE , one story home   OCCUPATION: retired from Administrator, sports,  Current hobbies: gardening, travel ,   PLOF: Independent  PATIENT GOALS:  Not pee in her pants, not go to the bathroom so often    OBJECTIVE:     [facility] PT Assessment - 09/07/23 1458       Coordination   Coordination and Movement Description proper deep core coordination without cues in sitting and upright standing             [facility] Adult PT Treatment/Exercise - 09/07/23 0001       Therapeutic Activites    Other Therapeutic Activities explained the role of nn system on urinary frequency and showed images / explained Bradley loops , guided relaxation techniques in stressful situations, explained to not build habit of doing puzzles to start urination and to use more breathing patterns to relax the body, increase self-awareness when feeling stressed      Neuro Re-ed    Neuro Re-ed Details  cued for relaxation and use of deep core level 1 in seated and standing position in stressful situations             HOME EXERCISE PROGRAM: See pt instruction section    ASSESSMENT:  CLINICAL IMPRESSION:               Today, explained the role of nn system on urinary  frequency / leakage and showed images / explained Bradley loops , guided relaxation techniques in stressful situations, explained to not build habit of doing puzzles to start urination and to use more breathing patterns to relax the body, increase self-awareness when feeling stressed .  Pt demo'd ability to perform deep core breathing in seated and standing position. Educated pt to apply breathing techniques for stress management.  Explained rationale for learning kegel programs yet.              Plan to assess L knee and LKC at next session.   Plan to assess her self-selected exercises she does in the pool to ensure they are not contributing to radiating LBP. Plan to review body mechanics for gardening to address LBP.    Pt benefits from skilled PT.    OBJECTIVE IMPAIRMENTS decreased activity tolerance, decreased coordination, decreased endurance, decreased mobility, difficulty walking, decreased ROM, decreased strength, decreased safety awareness, hypomobility, increased muscle spasms, impaired flexibility, improper body mechanics, postural dysfunction, and pain. scar restrictions   ACTIVITY LIMITATIONS  self-care,  sleep, home chores, work tasks    PARTICIPATION LIMITATIONS:  community, gym activities    PERSONAL FACTORS        are also affecting patient's functional outcome.    REHAB POTENTIAL: Good   CLINICAL DECISION MAKING: Evolving/moderate complexity   EVALUATION COMPLEXITY: Moderate    PATIENT EDUCATION:    Education details: Showed pt anatomy images. Explained muscles attachments/ connection, physiology of deep core system/ spinal- thoracic-pelvis-lower kinetic chain as they relate to pt's presentation, Sx, and past Hx. Explained what and how these areas of deficits need to be restored to balance and function    See Therapeutic activity / neuromuscular re-education section  Answered pt's questions.   Person educated: Patient Education method: Explanation,  Demonstration, Tactile cues, Verbal cues, and Handouts Education comprehension: verbalized understanding, returned demonstration, verbal cues required, tactile cues required, and needs further education     PLAN: PT FREQUENCY: 1x/week   PT DURATION: 10 weeks   PLANNED INTERVENTIONS: Therapeutic exercises, Therapeutic activity, Neuromuscular re-education, Balance training, Gait training, Patient/Family education, Self Care, Joint mobilization, Spinal mobilization, Moist heat, Taping, and Manual therapy, dry needling.   PLAN FOR NEXT SESSION: See clinical impression for plan     GOALS: Goals reviewed with patient? Yes  SHORT TERM GOALS: Target date: 07/08/2023    Pt will demo IND with HEP                    Baseline: Not IND            Goal status: MET    LONG TERM GOALS: Target date: 10/29/2023      1.Pt will demo proper deep core coordination without chest breathing and optimal excursion of diaphragm/pelvic floor in order to promote spinal stability and pelvic floor function  Baseline: dyscoordination Goal status: MET   2.  Pt will demo > 5 pt change on FOTO  to improve QOL and function  PFDI Urinary baseline - 17  -> 13  Lower score = better function  MET   Urinary Problem baseline-  56  -> 56 Higher score = better function ONGOING   Prolapse baseline - 8   --> 13  Lower score = better function   ONGOING   Lumber baseline  -52   --> 61 Higher score = better function  MET    Goal status  3.  Pt will demo proper body mechanics in against gravity tasks and ADLs  work tasks, fitness  to minimize straining pelvic floor / back    Baseline: not IND, improper form that places strain on pelvic floor  Goal status: MET     4. Pt will demo increased gait speed >  1.3 m/s with reciprocal gait pattern, longer stride length  in order to ambulate safely in community and return to fitness routine  Baseline:  Goal status: ongoing     5. Pt reports decreased urge  incontinence to <1 a month  in order improve hygiene and continence  Baseline:  urge incontinence 2 x month  Goal status: Partially Met   ( 07/15/23: no continence for two weeks)    6. Pt will no longer lean forward for emptying bladder and demo proper coordination of pelvic floor for lengthening and contraction with deep core coordination   Baseline:  Pt leans forward to pee and complete emptying  Goal status:  Ongoing     7.  Pt will report cook and stand in line for > 30 min without radiating pain down the L knee when walking Baseline:  Radiating pain on L hip to lateral L knee  Occurs with   Goal status:  Ongoing     Mariane Masters, PT 09/07/2023, 9:49 AM

## 2023-09-08 ENCOUNTER — Ambulatory Visit: Payer: Medicare Other | Admitting: Physical Therapy

## 2023-09-10 ENCOUNTER — Encounter: Payer: Medicare Other | Admitting: Physical Therapy

## 2023-09-10 ENCOUNTER — Telehealth: Payer: Self-pay | Admitting: Physical Therapy

## 2023-09-10 NOTE — Telephone Encounter (Signed)
Physical therapist returned pt's request for call to answer her questions about HEP.  Therapist further explained about the pelvic internal exam and answered her answers . Pt stated she would like to proceed with internal assessment at next session. Also discussed pt about her request to focus on returning to gardening and measuring her leg length difference at upcoming sessions.

## 2023-09-16 ENCOUNTER — Ambulatory Visit: Payer: Medicare Other | Admitting: Physical Therapy

## 2023-09-16 DIAGNOSIS — R2689 Other abnormalities of gait and mobility: Secondary | ICD-10-CM

## 2023-09-16 DIAGNOSIS — M25561 Pain in right knee: Secondary | ICD-10-CM | POA: Diagnosis not present

## 2023-09-16 DIAGNOSIS — M5459 Other low back pain: Secondary | ICD-10-CM

## 2023-09-16 DIAGNOSIS — M533 Sacrococcygeal disorders, not elsewhere classified: Secondary | ICD-10-CM

## 2023-09-16 DIAGNOSIS — G8929 Other chronic pain: Secondary | ICD-10-CM

## 2023-09-16 NOTE — Therapy (Signed)
OUTPATIENT PHYSICAL THERAPY TREATMENT  / Discharge Summary across 12 visits      Patient Name: Tracy Rodgers MRN: [id number] DOB:1946-01-19, 77 y.o., female Today's Date: 09/16/2023   PT End of Session - 09/16/23 1513     Visit Number 12    Number of Visits 19    Date for PT Re-Evaluation 10/29/23    PT Start Time 1507    PT Stop Time 1545    PT Time Calculation (min) 38 min    Activity Tolerance Patient tolerated treatment well;No increased pain    Behavior During Therapy WFL for tasks assessed/performed             Past Medical History:  Diagnosis Date   Achilles tendinitis    Allergic rhinitis    Arthritis    Cystocele, unspecified (CODE)    Diverticulosis    Dyspareunia, female    Elevated cholesterol    GERD (gastroesophageal reflux disease)    History of heavy periods    HLD (hyperlipidemia)    Impaired glucose tolerance    Melanoma (HCC)    on her back   Melanoma in situ of back (HCC)    Osteopenia    Painful menstrual periods    Rectocele    Urticaria    Uterine prolapse    Vitamin D deficiency    Past Surgical History:  Procedure Laterality Date   APPENDECTOMY     BREAST BIOPSY Left 1991   neg   BREAST EXCISIONAL BIOPSY Left 1991   broken nose     COLONOSCOPY     COLONOSCOPY N/A 12/30/2020   Procedure: COLONOSCOPY;  Surgeon: Regis Bill, MD;  Location: [facility] ENDOSCOPY;  Service: Endoscopy;  Laterality: N/A;  Patient says she needs Scolpamine patch after anesthesia for nausea.   SEPTOPLASTY     SKIN CANCER EXCISION     Patient Active Problem List   Diagnosis Date Noted   History of melanoma excision 07/21/2016   Melanosis of vulva 07/21/2016   Rectocele 07/21/2016   Midline cystocele 07/21/2016   Left knee pain 12/23/2015   History of motion sickness 09/20/2014   Allergic rhinitis 09/13/2014   Gastroesophageal reflux 09/13/2014   Hyperlipidemia, unspecified 09/13/2014   Impaired glucose tolerance 09/13/2014   Vaginal atrophy  09/13/2014   Vitamin D deficiency 09/13/2014   History of adenomatous polyp of colon 08/14/2014    PCP: Nemiah Commander  REFERRING PROVIDER: Renita Papa DIAG: Uterine prolapse without mention of vaginal wall prolapse  Rationale for Evaluation and Treatment Rehabilitation  THERAPY DIAG:     Sacrococcygeal disorders, not elsewhere classified   Other abnormalities of gait and mobility   Other low back pain  Chronic pain of both knees    ONSET DATE:   SUBJECTIVE:        SUBJECTIVE STATEMENT TODAY:  Pt has noticed she does not have to lean as much to get urine all out. Ot feels she like leaked but the pad is dry and yet she can still smell an odor on the pad. Pt thinks the odor is because she is going longer between going to the bathroom. Pt has noticed a bulge in the middle.   SUBJECTIVE STATEMENT on EVAL 06/10/23 :  1) difficulty emptying bladder / leakage :  Pt leans forward to pees again . This started 4-5 years ago. Pt has been wearing a pessary for 5 years which helped with emptying her bladder.  Currently pt gets her pessary checked every 6 months  with Dr. Dalbert Garnet. Denied straining with BMs. Pt has complete movements.  Leakage is occurring all the time. Pt changes Poise pads once a day.    2) CLBP :  at worst 8-10/10 occurs with strenuous activities, gardening, when getting out of bed, Pt has a broken tailbone 20+ years ago.   Radiating pain on L hip to lateral L knee  Occurs with standing, walking > 15 min. It does not happen with gardening/  swimming.      3) R groin pain started 2 months ago:  started as a pinching at 9/10  that comes randomly when walking, standing, stepping   and it goes away with sitting and eases 3-5 min. Does not radiate.    4) B knee pain,: L knee pain is worst than R  level 8/10 . OA . Completed PT for L patella that was out of alignment. It started hurting again 2 years ago. R knee started to hurt. Pt was told collagen is lacking  . Completed  PT for R knee as well. PT gave up on her and was recommended on knee replacement. Pt saw a second / third opinion which advised her to TKA is not needed at this time.     PERTINENT HISTORY:  OA on B knees , no prolapse surgeries,  appendectomy, 3 vaginal deliveries,  Hx of fall and fell on her nose 20 years ago and also had a fall on her tailbone before then   Fitness:  pool exercise in her personal pool ( running, riding bike, jumping jacks, PT HEP for her back   PAIN:  Are you having pain? Yes: see above  PRECAUTIONS: None  WEIGHT BEARING RESTRICTIONS: No  FALLS:  Has patient fallen in last 6 months? No   LIVING ENVIRONMENT: Lives with: lives with their spouse Lives in: house  Stairs: stoop STE , one story home   OCCUPATION: retired from Administrator, sports,  Current hobbies: gardening, travel ,   PLOF: Independent  PATIENT GOALS:  Not pee in her pants, not go to the bathroom so often    OBJECTIVE:    [facility] PT Assessment - 09/16/23 1531       Bed Mobility   Bed Mobility --   poor carry over with sit > supine t/f     Ambulation/Gait   Gait Comments 1.39 m/s  with shoe lift in R , toe box and heel             Pelvic Floor Special Questions - 09/16/23 1609     External Perineal Exam without clothes: no pelvic organ outside introitus, able to elicit upward movement with cue for quick contractions             [facility] Adult PT Treatment/Exercise - 09/16/23 1531       Therapeutic Activites    Other Therapeutic Activities explained continuation of HEP stretch for manging arthritis, measured leg length per pt 's request, assessed pelvic floor through external obervation ( no internal assessment)  for prolapse per pt 's request  and explained improvements, reviewed HEP to continue                HOME EXERCISE PROGRAM: See pt instruction section    ASSESSMENT:  CLINICAL IMPRESSION:  Pt met 7/7 goals with the following  improvements :    Pt has had not LBP for a month. It comes and goes and she knows when to rest and not overdo. Pt has not  had tingling in her L leg since the last session.    Resolved L knee and R groin pain Pt demo'd levelled pelvis and spine with shoe lift in R shoe toe box and heel Gait speed increased from 1.19 m/s  MET  1.39 m/s  with shoe lift in R , toe box and heel  Pt saw her OB/GYN who remeasured her prolapse and was told she has Stage I prolapse compared to Stage 2 which was measured 5 years ago. Pt still wears a pessary.  FOTO scores improved indicating improved function     Explained continuation of HEP stretch for manging arthritis, measured leg length per pt 's request, assessed pelvic floor through external observation ( no internal assessment) for prolapse per pt 's request  and explained improvements, reviewed HEP.  Reviewed body mechanics for gardening to address LBP.    Pt is ready for d/c.      OBJECTIVE IMPAIRMENTS decreased activity tolerance, decreased coordination, decreased endurance, decreased mobility, difficulty walking, decreased ROM, decreased strength, decreased safety awareness, hypomobility, increased muscle spasms, impaired flexibility, improper body mechanics, postural dysfunction, and pain. scar restrictions   ACTIVITY LIMITATIONS  self-care,  sleep, home chores, work tasks    PARTICIPATION LIMITATIONS:  community, gym activities    PERSONAL FACTORS        are also affecting patient's functional outcome.    REHAB POTENTIAL: Good   CLINICAL DECISION MAKING: Evolving/moderate complexity   EVALUATION COMPLEXITY: Moderate    PATIENT EDUCATION:    Education details: Showed pt anatomy images. Explained muscles attachments/ connection, physiology of deep core system/ spinal- thoracic-pelvis-lower kinetic chain as they relate to pt's presentation, Sx, and past Hx. Explained what and how these areas of deficits need to be restored to balance and function    See  Therapeutic activity / neuromuscular re-education section  Answered pt's questions.   Person educated: Patient Education method: Explanation, Demonstration, Tactile cues, Verbal cues, and Handouts Education comprehension: verbalized understanding, returned demonstration, verbal cues required, tactile cues required, and needs further education     PLAN: PT FREQUENCY: 1x/week   PT DURATION: 10 weeks   PLANNED INTERVENTIONS: Therapeutic exercises, Therapeutic activity, Neuromuscular re-education, Balance training, Gait training, Patient/Family education, Self Care, Joint mobilization, Spinal mobilization, Moist heat, Taping, and Manual therapy, dry needling.   PLAN FOR NEXT SESSION: See clinical impression for plan     GOALS: Goals reviewed with patient? Yes  SHORT TERM GOALS: Target date: 07/08/2023    Pt will demo IND with HEP                    Baseline: Not IND            Goal status: MET    LONG TERM GOALS: Target date: 10/29/2023      1.Pt will demo proper deep core coordination without chest breathing and optimal excursion of diaphragm/pelvic floor in order to promote spinal stability and pelvic floor function  Baseline: dyscoordination Goal status: MET   2.  Pt will demo > 5 pt change on FOTO  to improve QOL and function  PFDI Urinary baseline - 17  -> 13  Lower score = better function  MET   Urinary Problem baseline-  56  -> 56 Higher score = better function ONGOING   Prolapse baseline - 8   --> 13  Lower score = better function   ONGOING   Lumber baseline  -52   --> 61 Higher score = better function  MET    Goal status  3.  Pt will demo proper body mechanics in against gravity tasks and ADLs  work tasks, fitness  to minimize straining pelvic floor / back    Baseline: not IND, improper form that places strain on pelvic floor  Goal status: MET     4. Pt will demo increased gait speed > 1.3 m/s with reciprocal gait pattern, longer stride length  in  order to ambulate safely in community and return to fitness routine  Baseline: 1.19 m/s  decreased RLE stance, limited posterior rotation of pelvis)  Goal status:  MET  1.39 m/s  with shoe lift in R , toe box and heel     5. Pt reports decreased urge incontinence to <1 a month  in order improve hygiene and continence  Baseline:  urge incontinence 2 x month  Goal status: Met   ( 07/15/23: no continence for two weeks,  09/16/23: no leakage across 2 months )    6. Pt will no longer lean forward for emptying bladder and demo proper coordination of pelvic floor for lengthening and contraction with deep core coordination   Baseline:  Pt leans forward to pee and complete emptying  Goal status:  MET     7.  Pt will report cook and stand in line for > 30 min without radiating pain down the L knee when walking Baseline:  Radiating pain on L hip to lateral L knee  Occurs with   Goal status:  MET      Mariane Masters, PT 09/16/2023, 3:13 PM

## 2023-09-17 ENCOUNTER — Ambulatory Visit: Payer: Medicare Other | Admitting: Physical Therapy

## 2023-09-22 ENCOUNTER — Encounter: Payer: Medicare Other | Admitting: Physical Therapy

## 2023-09-24 ENCOUNTER — Ambulatory Visit: Payer: Medicare Other | Admitting: Physical Therapy

## 2023-12-29 ENCOUNTER — Other Ambulatory Visit: Payer: Self-pay | Admitting: Internal Medicine

## 2023-12-29 DIAGNOSIS — Z1231 Encounter for screening mammogram for malignant neoplasm of breast: Secondary | ICD-10-CM

## 2024-01-18 ENCOUNTER — Ambulatory Visit
Admission: RE | Admit: 2024-01-18 | Discharge: 2024-01-18 | Disposition: A | Discharge status: Home / Self Care | Source: Ambulatory Visit | Attending: Internal Medicine | Admitting: Internal Medicine

## 2024-01-18 DIAGNOSIS — Z1231 Encounter for screening mammogram for malignant neoplasm of breast: Secondary | ICD-10-CM | POA: Diagnosis present

## 2024-04-21 ENCOUNTER — Telehealth: Payer: Self-pay | Admitting: Family Medicine

## 2024-04-28 NOTE — Telephone Encounter (Signed)
 Phone call to pt at [phone]. Left message stating that [facility] RN is calling to follow-up, want to make sure someone had responded and questions were answered. If still need assistance, please call Nadalyn Deringer at [phone].

## 2024-06-28 ENCOUNTER — Emergency Department

## 2024-06-28 ENCOUNTER — Other Ambulatory Visit: Payer: Self-pay

## 2024-06-28 ENCOUNTER — Emergency Department
Admission: EM | Admit: 2024-06-28 | Discharge: 2024-06-28 | Discharge status: Against Medical Advice | Attending: Emergency Medicine | Admitting: Emergency Medicine

## 2024-06-28 DIAGNOSIS — M25561 Pain in right knee: Secondary | ICD-10-CM | POA: Insufficient documentation

## 2024-06-28 DIAGNOSIS — M25562 Pain in left knee: Secondary | ICD-10-CM | POA: Diagnosis not present

## 2024-06-28 DIAGNOSIS — S0081XA Abrasion of other part of head, initial encounter: Secondary | ICD-10-CM | POA: Diagnosis present

## 2024-06-28 DIAGNOSIS — Y9234 Swimming pool (public) as the place of occurrence of the external cause: Secondary | ICD-10-CM | POA: Insufficient documentation

## 2024-06-28 DIAGNOSIS — R519 Headache, unspecified: Secondary | ICD-10-CM | POA: Insufficient documentation

## 2024-06-28 DIAGNOSIS — W16012A Fall into swimming pool striking water surface causing other injury, initial encounter: Secondary | ICD-10-CM | POA: Insufficient documentation

## 2024-06-28 DIAGNOSIS — M25511 Pain in right shoulder: Secondary | ICD-10-CM | POA: Diagnosis not present

## 2024-06-28 DIAGNOSIS — Z5321 Procedure and treatment not carried out due to patient leaving prior to being seen by health care provider: Secondary | ICD-10-CM | POA: Diagnosis not present

## 2024-06-28 DIAGNOSIS — Y9311 Activity, swimming: Secondary | ICD-10-CM | POA: Diagnosis not present

## 2024-06-28 NOTE — ED Triage Notes (Addendum)
 Pt reports she fell tonight getting out of the pool, pt landed on the concrete with the right side of her face. Pt has abrasion to face and swelling to lips. Pt denies LOC. Pt c/o pain to her face and bilateral knees and right shoulder

## 2024-06-28 NOTE — ED Notes (Signed)
 Pt's daughter Sonny to STAT desk to inquire over wait time; updated on such and assured if there are any abnormalities her acuity would be changed; daughter voices understanding and reports that she is a NP and is taking pt home and will be staying with her tonight; will return for any new or worsening symptoms

## 2024-11-02 ENCOUNTER — Other Ambulatory Visit: Payer: Self-pay | Admitting: Internal Medicine

## 2024-11-02 DIAGNOSIS — Z1231 Encounter for screening mammogram for malignant neoplasm of breast: Secondary | ICD-10-CM

## 2024-11-03 ENCOUNTER — Other Ambulatory Visit: Payer: Self-pay | Admitting: Internal Medicine

## 2024-11-03 DIAGNOSIS — Z1231 Encounter for screening mammogram for malignant neoplasm of breast: Secondary | ICD-10-CM

## 2025-01-18 ENCOUNTER — Ambulatory Visit
